# Patient Record
Sex: Male | Born: 1935 | Race: White | Hispanic: No | Smoking: Never smoker
Health system: Southern US, Community
[De-identification: ages and names within clinical notes are randomized; demographics above are authoritative.]

## PROBLEM LIST (undated history)

## (undated) DIAGNOSIS — I4729 Other ventricular tachycardia: Secondary | ICD-10-CM

## (undated) DIAGNOSIS — J45909 Unspecified asthma, uncomplicated: Secondary | ICD-10-CM

## (undated) DIAGNOSIS — I495 Sick sinus syndrome: Secondary | ICD-10-CM

## (undated) DIAGNOSIS — I2510 Atherosclerotic heart disease of native coronary artery without angina pectoris: Secondary | ICD-10-CM

## (undated) DIAGNOSIS — D699 Hemorrhagic condition, unspecified: Secondary | ICD-10-CM

## (undated) DIAGNOSIS — F0280 Dementia in other diseases classified elsewhere without behavioral disturbance: Secondary | ICD-10-CM

## (undated) DIAGNOSIS — I472 Ventricular tachycardia: Secondary | ICD-10-CM

## (undated) DIAGNOSIS — I499 Cardiac arrhythmia, unspecified: Secondary | ICD-10-CM

## (undated) DIAGNOSIS — C61 Malignant neoplasm of prostate: Principal | ICD-10-CM

## (undated) DIAGNOSIS — M1990 Unspecified osteoarthritis, unspecified site: Secondary | ICD-10-CM

## (undated) DIAGNOSIS — K219 Gastro-esophageal reflux disease without esophagitis: Secondary | ICD-10-CM

## (undated) DIAGNOSIS — I10 Essential (primary) hypertension: Secondary | ICD-10-CM

## (undated) DIAGNOSIS — E785 Hyperlipidemia, unspecified: Secondary | ICD-10-CM

## (undated) DIAGNOSIS — E538 Deficiency of other specified B group vitamins: Secondary | ICD-10-CM

## (undated) HISTORY — DX: Unspecified asthma, uncomplicated: J45.909

## (undated) HISTORY — DX: Gastro-esophageal reflux disease without esophagitis: K21.9

## (undated) HISTORY — PX: BYPASS GRAFT: SHX909

## (undated) HISTORY — PX: CIRCUMCISION: SUR203

## (undated) HISTORY — PX: CHOLECYSTECTOMY: SHX55

## (undated) HISTORY — DX: Hemorrhagic condition, unspecified: D69.9

## (undated) HISTORY — PX: CORONARY ARTERY BYPASS GRAFT: SHX141

## (undated) HISTORY — DX: Unspecified osteoarthritis, unspecified site: M19.90

## (undated) HISTORY — DX: Malignant neoplasm of prostate: C61

## (undated) HISTORY — PX: INSERT / REPLACE / REMOVE PACEMAKER: SUR710

---

## 1988-08-01 DIAGNOSIS — G9341 Metabolic encephalopathy: Secondary | ICD-10-CM | POA: Diagnosis present

## 1988-08-01 DIAGNOSIS — C61 Malignant neoplasm of prostate: Secondary | ICD-10-CM

## 1988-08-01 DIAGNOSIS — I482 Chronic atrial fibrillation, unspecified: Secondary | ICD-10-CM | POA: Diagnosis present

## 1988-08-01 DIAGNOSIS — I959 Hypotension, unspecified: Secondary | ICD-10-CM | POA: Diagnosis present

## 1988-08-01 DIAGNOSIS — I2510 Atherosclerotic heart disease of native coronary artery without angina pectoris: Secondary | ICD-10-CM | POA: Diagnosis present

## 1988-08-01 HISTORY — DX: Malignant neoplasm of prostate: C61

## 2009-11-04 ENCOUNTER — Ambulatory Visit: Payer: Self-pay | Admitting: Urology

## 2009-11-18 ENCOUNTER — Ambulatory Visit: Payer: Self-pay | Admitting: Urology

## 2016-07-06 DIAGNOSIS — E782 Mixed hyperlipidemia: Secondary | ICD-10-CM | POA: Insufficient documentation

## 2016-07-06 DIAGNOSIS — I2510 Atherosclerotic heart disease of native coronary artery without angina pectoris: Secondary | ICD-10-CM | POA: Insufficient documentation

## 2016-07-06 DIAGNOSIS — C61 Malignant neoplasm of prostate: Secondary | ICD-10-CM | POA: Insufficient documentation

## 2016-07-07 ENCOUNTER — Ambulatory Visit: Payer: Self-pay | Admitting: Urology

## 2016-08-05 ENCOUNTER — Ambulatory Visit (INDEPENDENT_AMBULATORY_CARE_PROVIDER_SITE_OTHER): Payer: Medicare Other | Admitting: Urology

## 2016-08-05 ENCOUNTER — Other Ambulatory Visit: Payer: Self-pay

## 2016-08-05 ENCOUNTER — Other Ambulatory Visit
Admission: RE | Admit: 2016-08-05 | Discharge: 2016-08-05 | Disposition: A | Discharge status: Home / Self Care | Payer: Medicare Other | Source: Ambulatory Visit | Attending: Urology | Admitting: Urology

## 2016-08-05 ENCOUNTER — Encounter: Payer: Self-pay | Admitting: Urology

## 2016-08-05 VITALS — BP 148/73 | HR 75 | Ht 62.0 in | Wt 163.0 lb

## 2016-08-05 DIAGNOSIS — N529 Male erectile dysfunction, unspecified: Secondary | ICD-10-CM | POA: Insufficient documentation

## 2016-08-05 DIAGNOSIS — C61 Malignant neoplasm of prostate: Secondary | ICD-10-CM

## 2016-08-05 DIAGNOSIS — K644 Residual hemorrhoidal skin tags: Secondary | ICD-10-CM

## 2016-08-05 NOTE — Progress Notes (Signed)
08/05/2016 5:15 PM   Harry Vargas 06/10/1929 [id number]  Referring provider: Rusty Aus, MD [facility] [place], [place] [address]  Chief Complaint  Patient presents with  . New Patient (Initial Visit)    Prostate cancer    HPI: 83 year old male who presents today to establish care in [facility] for history of prostate cancer.  He was previously followed by Dr. Harless Nakayama at [facility] until recently.  He and his wife have recently moved to live with his daughter who lives in the area.  He has a history of cT1c prostate cancer, Gleason 7 managed on Lupron.  It appears that this was likely diagnosed in 2011 by Dr. Bernardo Heater while living in the [place] area for a year.  Archived bone scan from that time shows no obvious evidence of metastatic disease.  It does appear that he also had an MR with and without contrast of the pelvis which appears to show organ confined disease but does not remark on any structures outside of the prostate. Mr. Wootton thinks his PSA was around 14 at the time of diagnosis.  He moved to [place] shortly thereafter at which time his options were discussed. He recalls being offered radiation, prostatectomy, or androgen deprivation therapy which he elected. He's been on the medication of her since.  He is tolerated it quite well. He does have occasional rare hot flashes. He's had some issues with libido and erectile dysfunction.  He does take calcium and vitamin D supplementation.  He did have a recent labs and his ADT therapy in 2016 at which time his testosterone rose to 270 and his PSA jumped to a 0.36 on 12/26/2014.    Based on the records reviewed today, at least 50+ pages, date of diagnosis, actual pathology report, staging workup, PSA at the time of diagnosis are all unknown.  It seems like his last Lupron injection was some time in late 2016 or early 2017.  He denies any recent weight  loss or bone pain.  He does complain today of erectile dysfunction. He does wife have been discussing the possibility of becoming sexually active again. He's had issues achieving and maintaining an erection. He spots of over-the-counter supplements from [facility] to help with this but see no effect. He's never tried Viagra or any other PDE 5 inhibitor. He has no contraindications for this. He does have a remote history of coronary artery disease but does not take nitrates and his stable.  He does have a history of phimosis and underwent recent circumcision within the past few years.     PMH: Past Medical History:  Diagnosis Date  . Arthritis   . Asthma   . Bleeding disorder ([place])   . GERD (gastroesophageal reflux disease)   . Prostate cancer [facility])     Surgical History: Past Surgical History:  Procedure Laterality Date  . BYPASS GRAFT    . CIRCUMCISION      Home Medications:  Allergies as of 08/05/2016   Not on File     Medication List       Accurate as of 08/05/16  5:15 PM. Always use your most recent med list.          acetaminophen 650 MG CR tablet Commonly known as:  TYLENOL Take by mouth.   Apple Cider Vinegar 600 MG Caps Take by mouth.   aspirin EC 81 MG tablet Take by mouth.   clonazePAM 1 MG tablet Commonly known as:  KLONOPIN Take by mouth.   polyethylene glycol powder powder Commonly known as:  GLYCOLAX/MIRALAX Take by mouth.   pravastatin 20 MG tablet Commonly known as:  PRAVACHOL Take by mouth.       Allergies: Not on File  Family History: Family History  Problem Relation Age of Onset  . Prostate cancer Neg Hx   . Bladder Cancer Neg Hx   . Kidney cancer Neg Hx     Social History:  reports that he has never smoked. He has never used smokeless tobacco. He reports that he does not drink alcohol or use drugs.  ROS: UROLOGY Frequent Urination?: Yes Hard to postpone urination?: No Burning/pain with urination?: No Get up at night to urinate?:  No Leakage of urine?: No Urine stream starts and stops?: No Trouble starting stream?: Yes Do you have to strain to urinate?: No Blood in urine?: No Urinary tract infection?: No Sexually transmitted disease?: No Injury to kidneys or bladder?: No Painful intercourse?: No Weak stream?: No Erection problems?: No Penile pain?: No  Gastrointestinal Nausea?: No Vomiting?: No Indigestion/heartburn?: No Diarrhea?: No Constipation?: No  Constitutional Fever: No Night sweats?: No Weight loss?: No Fatigue?: No  Skin Skin rash/lesions?: No Itching?: No  Eyes Blurred vision?: No Double vision?: No  Ears/Nose/Throat Sore throat?: No Sinus problems?: No  Hematologic/Lymphatic Swollen glands?: No Easy bruising?: No  Cardiovascular Leg swelling?: No Chest pain?: No  Respiratory Cough?: No Shortness of breath?: No  Endocrine Excessive thirst?: No  Musculoskeletal Back pain?: Yes Joint pain?: Yes  Neurological Headaches?: No Dizziness?: No  Psychologic Depression?: No Anxiety?: No  Physical Exam: BP (!) 148/73 (BP Location: Right Arm, Patient Position: Sitting, Cuff Size: Normal)   Pulse 75   Ht 5\' 2"  (1.575 m)   Wt 163 lb (73.9 kg)   BMI 29.81 kg/m   Constitutional:  Alert and oriented, No acute distress.  Accompanied by daughter today. HEENT: [place] AT, moist mucus membranes.  Trachea midline, no masses. Cardiovascular: No clubbing, cyanosis, or edema. Respiratory: Normal respiratory effort, no increased work of breathing. GI: Abdomen is soft, nontender, nondistended, no abdominal masses.  Small reducible the local hernia appreciated. Multiple abdominal scars. GU: No CVA tenderness. Circumcised phallus with easily retractable redundant foreskin. Bilateral descended testicles. Rectal: Significant inflamed and thrombosed external hemorrhoids, scant blood on pad and underwear appreciated. Prostate is small, 15-20 cc, mobile, nontender, no nodules. Skin: No  rashes, bruises or suspicious lesions. Lymph: No cervical or inguinal adenopathy. Neurologic: Grossly intact, no focal deficits, moving all 4 extremities. Psychiatric: Normal mood and affect.  Laboratory Data:  Labs performed at cranial clinic on 07/06/2016 Hemoglobin 15.2 Creatinine 1.1 UA unremarkable, no blood or evidence of infection  Pertinent Imaging: Scans from 2011 including bone scan and prostate MRI reviewed- report only  Assessment & Plan:    1. Prostate cancer [facility]) History probable localized T1c prostate cancer diagnosed in 2011, Gleason 7 managed on continuous ADT  (records somewhat limited) Based on review of PSA data, it appears that he had a small hiatus in 2016 from his ADT with significant rebound and his PSA which is somewhat concerning Plan to check PSAs/testosterone for baseline, unclear if overdue for Lupron If PSA is above nadir of what appears to be 0.3, we'll reinitiate ADT chronically Briefly reviewed the risks and benefits of chronic ADT Advised to continue calcium/vitamin D  Will call with PSA /testosterone results and arrange for reinitiation of therapy  - PSA - Testosterone  2. Erectile dysfunction, unspecified erectile  dysfunction type Interested in further treatment of erectile dysfunction  We discussed the pathophysiology of erectile dysfunction today along with possible congestive any factors. Discussed possible treatment options including PDE 5 inhibitors, vacuum erectile device, intracavernosal injection, MUSE, and placement of the inflatable or malleable penal prosthesis for refractory cases.  In terms of PDE 5 inhibitors, we discussed contraindications for this medication as well as common side effects. Patient was counseled on optimal use. All of his questions were answered in detail.  He was given samples of Viagra today, 100 mg advised to start with a half tablet.  He will call us and let us know if he like [place] to prescribe the generic  version of this medication.  3. External hemorrhoid, bleeding Bleeding, irritated external hemorrhoids appreciated today Findings reviewed with the patient and his daughter For symptomatic relief, with highly consider intervention for these,  Advised to discuss GI referral with PCP  Will call with follow up plan  Hollice Espy, MD  [place] [address], [place],  [address] [phone]  I spent 45 min with this patient of which greater than 50% was spent in counseling and coordination of care with the patient.  Greater than 50 pages of records along with previous imaging was reviewed.

## 2016-08-06 LAB — TESTOSTERONE

## 2016-08-06 LAB — PSA: PSA: 0.31 ng/mL (ref 0.00–4.00)

## 2016-08-09 ENCOUNTER — Telehealth: Payer: Self-pay

## 2016-08-09 NOTE — Telephone Encounter (Signed)
Pt daughter called requesting lab results and if pt is going to need a lupron injection. Please advise.

## 2016-08-10 NOTE — Telephone Encounter (Signed)
Discussed findings with patient's daughter. Patient remains a castrate levels, testosterone undetectable. His PSA stable at 0.3.  Based on these results, it is unclear when he received his last Lupron injection. I discussed that we will need to get the records from the cancer center where he was receiving these injections in order to determine when to give the next dose.  They will come by the office and by then, likely next Friday to sign a release to get these records.  In the interim, I have recommended follow-up in 3 months if we are unable to obtain these records. We could recheck PSA/testosterone then and assess whether or not clinically another doses warranted at that time.  Hollice Espy, MD

## 2016-08-22 NOTE — Telephone Encounter (Signed)
Records of pt receiving Lupron injections from [facility] in [place] were faxed over. Will have Angie scan them into chart.

## 2016-08-25 NOTE — Telephone Encounter (Signed)
Reviewed records from [place]. Patient last received Lupron on 01/12/2016.  This was a 6 month Depo.  Given that he is done very well with Lupron and that his PSA rebounded fairly significantly during a short break of ADT, feel that the best course of treatment would be to continue this.  He is due for his next Lupron shot. Please schedule a 6 month depo. I would like to see him in 6 months when he is due for his next Lupron with a PSA.   Hollice Espy, MD

## 2016-08-25 NOTE — Telephone Encounter (Signed)
App made and patient notified ° °Harry Vargas °

## 2016-08-26 ENCOUNTER — Ambulatory Visit (INDEPENDENT_AMBULATORY_CARE_PROVIDER_SITE_OTHER): Payer: Medicare Other | Admitting: Urology

## 2016-08-26 ENCOUNTER — Encounter: Payer: Self-pay | Admitting: Urology

## 2016-08-26 VITALS — BP 131/62 | HR 66

## 2016-08-26 DIAGNOSIS — C61 Malignant neoplasm of prostate: Secondary | ICD-10-CM | POA: Diagnosis not present

## 2016-08-26 MED ORDER — LEUPROLIDE ACETATE (6 MONTH) 45 MG IM KIT
45.0000 mg | PACK | Freq: Once | INTRAMUSCULAR | Status: AC
  Administered 2016-08-26: 45 mg via INTRAMUSCULAR

## 2016-08-26 NOTE — Progress Notes (Signed)
Lupron IM Injection   Due to Prostate Cancer patient is present today for a Lupron Injection.  Medication: Lupron 6 month Dose: 45 mg  Location: right upper outer buttocks Lot: [id number] Exp: 06/11/2017  Patient tolerated well, no complications were noted  Performed by: Fonnie Jarvis, CMA  Follow up: 6 months with PSA and Lupron

## 2016-09-02 ENCOUNTER — Telehealth: Payer: Self-pay | Admitting: Urology

## 2016-09-02 NOTE — Telephone Encounter (Signed)
Generally recommend bone health on ADT, recommend 1000-1200 mg daily calcium suppliment and [phone] IU vit D daily.   Please let him know and add to chart.  Hollice Espy, MD

## 2016-09-02 NOTE — Telephone Encounter (Signed)
Patient came into the office today to ask about medications.  In the past, he has taken an over the counter Calcium 600mg , plus Vitamin D 800IU twice a day supplement.  His previous urologist would tell him to stop taking it from time to time.  He is wondering if you think he should take it and if so, he wants to have this added to his medication list in his chart.   Please advise.

## 2016-09-05 NOTE — Telephone Encounter (Signed)
Spoke with pt in reference to bone health with ADT. Pt stated he was not understanding what was being said and requested to come to [facility]. Pt was added to nurse schedule.

## 2016-09-05 NOTE — Telephone Encounter (Signed)
LMOM

## 2016-09-09 ENCOUNTER — Ambulatory Visit: Payer: Medicare Other | Admitting: Urology

## 2016-09-27 DIAGNOSIS — I10 Essential (primary) hypertension: Secondary | ICD-10-CM | POA: Diagnosis present

## 2016-11-11 ENCOUNTER — Ambulatory Visit: Payer: Medicare Other | Admitting: Urology

## 2017-02-22 ENCOUNTER — Telehealth: Payer: Self-pay | Admitting: Urology

## 2017-02-22 NOTE — Telephone Encounter (Signed)
FYI Dr. Erlene Quan, Pt's daughter, Jackelyn Poling, [place] office left message on VM stating that she had to cancel her father's Lupron appt Friday February 24, 2017 due to him just getting out of hospital with pneumonia and not feeling well. Jackelyn Poling states that she will call to reschedule this appt when her father is feeling better.  Please be aware. Thanks.

## 2017-02-24 ENCOUNTER — Ambulatory Visit: Payer: Medicare Other | Admitting: Urology

## 2017-10-26 DIAGNOSIS — Z950 Presence of cardiac pacemaker: Secondary | ICD-10-CM | POA: Insufficient documentation

## 2018-01-10 DIAGNOSIS — I4892 Unspecified atrial flutter: Secondary | ICD-10-CM | POA: Diagnosis present

## 2018-11-30 ENCOUNTER — Emergency Department
Admission: EM | Admit: 2018-11-30 | Discharge: 2018-12-01 | Disposition: A | Discharge status: Other Acute Inpatient Hospital | Payer: Medicare Other | Attending: Emergency Medicine | Admitting: Emergency Medicine

## 2018-11-30 ENCOUNTER — Emergency Department: Payer: Medicare Other

## 2018-11-30 ENCOUNTER — Other Ambulatory Visit: Payer: Self-pay

## 2018-11-30 ENCOUNTER — Encounter: Payer: Self-pay | Admitting: *Deleted

## 2018-11-30 DIAGNOSIS — Z79899 Other long term (current) drug therapy: Secondary | ICD-10-CM | POA: Insufficient documentation

## 2018-11-30 DIAGNOSIS — R55 Syncope and collapse: Secondary | ICD-10-CM | POA: Diagnosis present

## 2018-11-30 DIAGNOSIS — J189 Pneumonia, unspecified organism: Secondary | ICD-10-CM

## 2018-11-30 DIAGNOSIS — Z7982 Long term (current) use of aspirin: Secondary | ICD-10-CM | POA: Insufficient documentation

## 2018-11-30 DIAGNOSIS — J45909 Unspecified asthma, uncomplicated: Secondary | ICD-10-CM | POA: Diagnosis not present

## 2018-11-30 DIAGNOSIS — A419 Sepsis, unspecified organism: Secondary | ICD-10-CM | POA: Diagnosis not present

## 2018-11-30 DIAGNOSIS — Z8546 Personal history of malignant neoplasm of prostate: Secondary | ICD-10-CM | POA: Diagnosis not present

## 2018-11-30 DIAGNOSIS — I959 Hypotension, unspecified: Secondary | ICD-10-CM

## 2018-11-30 DIAGNOSIS — Z20828 Contact with and (suspected) exposure to other viral communicable diseases: Secondary | ICD-10-CM | POA: Insufficient documentation

## 2018-11-30 LAB — HEPATIC FUNCTION PANEL
ALT: 22 U/L (ref 0–44)
AST: 31 U/L (ref 15–41)
Albumin: 4 g/dL (ref 3.5–5.0)
Alkaline Phosphatase: 69 U/L (ref 38–126)
Bilirubin, Direct: 0.1 mg/dL (ref 0.0–0.2)
Indirect Bilirubin: 1 mg/dL — ABNORMAL HIGH (ref 0.3–0.9)
Total Bilirubin: 1.1 mg/dL (ref 0.3–1.2)
Total Protein: 6.8 g/dL (ref 6.5–8.1)

## 2018-11-30 LAB — TROPONIN I: Troponin I: 0.03 ng/mL (ref <0.03)

## 2018-11-30 LAB — URINALYSIS, COMPLETE (UACMP) WITH MICROSCOPIC
Bacteria, UA: NONE SEEN
Bilirubin Urine: NEGATIVE
Glucose, UA: NEGATIVE mg/dL
Hgb urine dipstick: NEGATIVE
Ketones, ur: NEGATIVE mg/dL
Leukocytes,Ua: NEGATIVE
Nitrite: NEGATIVE
Protein, ur: 30 mg/dL — AB
Specific Gravity, Urine: 1.028 (ref 1.005–1.030)
pH: 5 (ref 5.0–8.0)

## 2018-11-30 LAB — LACTIC ACID, PLASMA
Lactic Acid, Venous: 3 mmol/L (ref 0.5–1.9)
Lactic Acid, Venous: 2.4 mmol/L (ref 0.5–1.9)

## 2018-11-30 LAB — CBC
HCT: 49.2 % (ref 39.0–52.0)
Hemoglobin: 16 g/dL (ref 13.0–17.0)
MCH: 27.2 pg (ref 26.0–34.0)
MCHC: 32.5 g/dL (ref 30.0–36.0)
MCV: 83.5 fL (ref 80.0–100.0)
Platelets: 161 10*3/uL (ref 150–400)
RBC: 5.89 MIL/uL — ABNORMAL HIGH (ref 4.22–5.81)
RDW: 14.7 % (ref 11.5–15.5)
WBC: 18.4 10*3/uL — ABNORMAL HIGH (ref 4.0–10.5)
nRBC: 0 % (ref 0.0–0.2)

## 2018-11-30 LAB — BASIC METABOLIC PANEL
Anion gap: 11 (ref 5–15)
BUN: 26 mg/dL — ABNORMAL HIGH (ref 8–23)
CO2: 24 mmol/L (ref 22–32)
Calcium: 9.5 mg/dL (ref 8.9–10.3)
Chloride: 107 mmol/L (ref 98–111)
Creatinine, Ser: 1.29 mg/dL — ABNORMAL HIGH (ref 0.61–1.24)
GFR calc Af Amer: 57 mL/min — ABNORMAL LOW (ref >60)
GFR calc non Af Amer: 50 mL/min — ABNORMAL LOW (ref >60)
Glucose, Bld: 141 mg/dL — ABNORMAL HIGH (ref 70–99)
Potassium: 3.7 mmol/L (ref 3.5–5.1)
Sodium: 142 mmol/L (ref 135–145)

## 2018-11-30 LAB — GLUCOSE, CAPILLARY: Glucose-Capillary: 129 mg/dL — ABNORMAL HIGH (ref 70–99)

## 2018-11-30 LAB — SARS CORONAVIRUS 2 BY RT PCR (HOSPITAL ORDER, PERFORMED IN ~~LOC~~ HOSPITAL LAB): SARS Coronavirus 2: NEGATIVE

## 2018-11-30 MED ORDER — SODIUM CHLORIDE 0.9 % IV BOLUS
1000.0000 mL | Freq: Once | INTRAVENOUS | Status: AC
  Administered 2018-11-30: 17:00:00 1000 mL via INTRAVENOUS

## 2018-11-30 MED ORDER — VANCOMYCIN HCL IN DEXTROSE 750-5 MG/150ML-% IV SOLN: 750.0000 mg | INTRAVENOUS | Status: DC

## 2018-11-30 MED ORDER — SODIUM CHLORIDE 0.9 % IV SOLN: 2.0000 g | Freq: Two times a day (BID) | INTRAVENOUS | Status: DC

## 2018-11-30 MED ORDER — VANCOMYCIN HCL IN DEXTROSE 1-5 GM/200ML-% IV SOLN: 1000.0000 mg | Freq: Once | INTRAVENOUS | Status: DC

## 2018-11-30 MED ORDER — SODIUM CHLORIDE 0.9 % IV SOLN
2.0000 g | Freq: Once | INTRAVENOUS | Status: AC
  Administered 2018-11-30: 2 g via INTRAVENOUS
  Filled 2018-11-30: qty 2

## 2018-11-30 MED ORDER — VANCOMYCIN HCL 10 G IV SOLR
1500.0000 mg | Freq: Once | INTRAVENOUS | Status: AC
  Administered 2018-11-30: 17:00:00 1500 mg via INTRAVENOUS
  Filled 2018-11-30: qty 1500

## 2018-11-30 MED ORDER — SODIUM CHLORIDE 0.9 % IV BOLUS
1000.0000 mL | Freq: Once | INTRAVENOUS | Status: AC
  Administered 2018-11-30: 15:00:00 1000 mL via INTRAVENOUS

## 2018-11-30 MED ORDER — METRONIDAZOLE IN NACL 5-0.79 MG/ML-% IV SOLN
500.0000 mg | Freq: Once | INTRAVENOUS | Status: AC
  Administered 2018-11-30: 500 mg via INTRAVENOUS
  Filled 2018-11-30: qty 100

## 2018-11-30 MED ORDER — SODIUM CHLORIDE 0.9 % IV SOLN
INTRAVENOUS | Status: DC
  Administered 2018-11-30: 21:00:00 via INTRAVENOUS

## 2018-11-30 MED ORDER — SODIUM CHLORIDE 0.9% FLUSH: 3.0000 mL | Freq: Once | INTRAVENOUS | Status: DC

## 2018-11-30 NOTE — ED Notes (Signed)
Pt's daughter Neoma Laming updated briefly over phone per pt request.

## 2018-11-30 NOTE — ED Notes (Signed)
Pt given food tray, apple juice, and water verbal okay from [place].

## 2018-11-30 NOTE — ED Notes (Signed)
Pt's daughter requesting pt be transferred to [facility]. Admitting notified. Will notify EDP.

## 2018-11-30 NOTE — ED Notes (Signed)
Pt's rhythm paced. Pt resting in bed comfortably. Rails up. Bed in lowest position. Reminded that urine sample needed.

## 2018-11-30 NOTE — ED Provider Notes (Addendum)
[facility] Emergency Department Provider Note  Time seen: 2:23 PM  I have reviewed the triage vital signs and the nursing notes.   HISTORY  Chief Complaint Loss of Consciousness   HPI Harry Vargas is a 85 y.o. male with a past medical history of arthritis, asthma, gastric reflux, presents to the emergency department for near syncope and hypotension.  According to the patient he was at home and he was shaking, feeling very unwell, felt very cold.  Patient called EMS who came out and checked him out, administered Tylenol but the patient did not want to be transported to the hospital at that time.  Patient then went to [facility] clinic walk-in to be evaluated.  Patient was found to be very weak and had a near syncopal episode while being evaluated.  Found to be hypotensive in the 60s and brought emergently to the emergency department for evaluation.  Upon arrival patient is awake alert and oriented, he is hypotensive 64/41.  Patient is afebrile but received Tylenol at home, unclear if the patient had a fever previously.  Denies any known fever today but states he has been shaking with chills all day.  Denies any cough or congestion.  Denies any chest or abdominal pain.  Denies any dysuria.  Largely negative review of systems.   Past Medical History:  Diagnosis Date  . Arthritis   . Asthma   . Bleeding disorder ([place])   . GERD (gastroesophageal reflux disease)   . Prostate cancer [facility])     Patient Active Problem List   Diagnosis Date Noted  . 3-vessel CAD 07/06/2016  . Cancer of prostate ([place]) 07/06/2016  . Hyperlipidemia, mixed 07/06/2016    Past Surgical History:  Procedure Laterality Date  . BYPASS GRAFT    . CIRCUMCISION      Prior to Admission medications   Medication Sig Start Date End Date Taking? Authorizing Provider  ipratropium (ATROVENT) 0.03 % nasal spray Place 1 spray into the nose 2 (two) times daily. 08/29/18 08/29/19 Yes [provider]   acetaminophen (TYLENOL) 650 MG CR tablet Take by mouth.    [provider]  Apple Cider Vinegar 600 MG CAPS Take by mouth.    [provider]  aspirin EC 81 MG tablet Take by mouth.    [provider]  Calcium 600-200 MG-UNIT tablet Take 1 tablet by mouth 2 (two) times daily with a meal.    [provider]  clonazePAM (KLONOPIN) 1 MG tablet Take by mouth.    [provider]  Leuprolide Acetate, 6 Month, (LUPRON DEPOT, 32-MONTH,) 45 MG injection Inject 45 mg into the muscle every 6 (six) months.    [provider]  polyethylene glycol powder (GLYCOLAX/MIRALAX) powder Take by mouth.    [provider]  pravastatin (PRAVACHOL) 20 MG tablet Take by mouth.    [provider]  vitamin B-12 (CYANOCOBALAMIN) 1000 MCG tablet Take 1,000 mcg by mouth daily.    [provider]    No Known Allergies  Family History  Problem Relation Age of Onset  . Prostate cancer Neg Hx   . Bladder Cancer Neg Hx   . Kidney cancer Neg Hx     Social History Social History   Tobacco Use  . Smoking status: Never Smoker  . Smokeless tobacco: Never Used  Substance Use Topics  . Alcohol use: No  . Drug use: No    Review of Systems Constitutional: No known fever.  Positive for chills  ENT: Negative for recent illness/congestion Cardiovascular: Negative for chest pain. Respiratory: Negative for shortness of breath.  Negative for cough. Gastrointestinal: Negative for abdominal pain, vomiting and diarrhea. Genitourinary: Negative for urinary compaints Musculoskeletal: Negative for musculoskeletal complaints Skin: Negative for skin complaints  Neurological: Negative for headache All other ROS negative  ____________________________________________   PHYSICAL EXAM:  VITAL SIGNS: ED Triage Vitals  Enc Vitals Group     BP 11/30/18 1401 (!) 64/41     Pulse Rate 11/30/18 1401 84     Resp 11/30/18 1401 14     Temp 11/30/18 1401  98.5 F (36.9 C)     Temp Source 11/30/18 1401 Oral     SpO2 11/30/18 1401 92 %     Weight 11/30/18 1402 150 lb (68 kg)     Height 11/30/18 1402 5\' 4"  (1.626 m)     Head Circumference --      Peak Flow --      Pain Score 11/30/18 1402 0     Pain Loc --      Pain Edu? --      Excl. in [place]? --    Constitutional: Patient is awake and alert, no acute distress. Eyes: Normal exam ENT      Head: Normocephalic and atraumatic.      Mouth/Throat: Mucous membranes are moist. Cardiovascular: Normal rate, regular rhythm.  Respiratory: Normal respiratory effort without tachypnea nor retractions. Breath sounds are clear  Gastrointestinal: Soft and nontender. No distention.   Musculoskeletal: Nontender with normal range of motion in all extremities. No lower extremity tenderness or edema. Neurologic:  Normal speech and language. No gross focal neurologic deficits Skin:  Skin is warm, dry and intact.  Psychiatric: Mood and affect are normal.   ____________________________________________    EKG  EKG viewed and interpreted by myself shows an atrial paced rhythm at 79 bpm with a narrow QRS, left axis deviation, largely normal intervals with nonspecific ST changes.  ____________________________________________    RADIOLOGY  Chest x-ray shows bibasilar opacification atelectasis versus infection.  ____________________________________________   INITIAL IMPRESSION / ASSESSMENT AND PLAN / ED COURSE  Pertinent labs & imaging results that were available during my care of the patient were reviewed by me and considered in my medical decision making (see chart for details).   Patient presents to the emergency department for near syncope found to be hypotensive 64/41.  Reported chills at home but no measured temperature at any point.  Differential is quite broad but would include hypotension due to infectious etiology such as sepsis, urinary tract infection or pneumonia, hypotension due to renal  insufficiency, hypovolemia or dehydration.  We will check labs, blood cultures, urinalysis, urine culture.  We will IV hydrate, obtain chest x-ray imaging and continue to closely monitor.  Lab work is resulting showing a leukocytosis of 18,000, lactate greater than 3.0.  Given the patient's hypotension leukocytosis elevated lactic acidosis we will cover broad-spectrum antibiotics, start IV fluids.  Patient will require admission to the hospital service.  I discussed patient with Dr. Posey Pronto of the hospitalist service, we will check a COVID screen prior to admission  Harry Vargas was evaluated in Emergency Department on 11/30/2018 for the symptoms described in the history of present illness. He was evaluated in the context of the global COVID-19 pandemic, which necessitated consideration that the patient might be at risk for infection with the SARS-CoV-2 virus that causes COVID-19. Institutional protocols and algorithms that pertain to the evaluation of patients at risk for COVID-19  are in a state of rapid change based on information released by regulatory bodies including the CDC and federal and state organizations. These policies and algorithms were followed during the patient's care in the ED.  CRITICAL CARE Performed by: Harvest Dark   Total critical care time: 30 minutes  Critical care time was exclusive of separately billable procedures and treating other patients.  Critical care was necessary to treat or prevent imminent or life-threatening deterioration.  Critical care was time spent personally by me on the following activities: development of treatment plan with patient and/or surrogate as well as nursing, discussions with consultants, evaluation of patient's response to treatment, examination of patient, obtaining history from patient or surrogate, ordering and performing treatments and interventions, ordering and review of laboratory studies, ordering and review of radiographic studies,  pulse oximetry and re-evaluation of patient's condition.   ____________________________________________   FINAL CLINICAL IMPRESSION(S) / ED DIAGNOSES  Near syncope Hypotension Sepsis Pneumonia   Harvest Dark, MD 11/30/18 1529    Harvest Dark, MD 01/01/19 2037

## 2018-11-30 NOTE — ED Notes (Signed)
[facility] [id number]; [id number] report number. Harry Vargas Dr. [phone] transport number.

## 2018-11-30 NOTE — ED Notes (Signed)
ED TO INPATIENT HANDOFF REPORT  ED Nurse Name and Phone #: Metta Clines [phone]  S Name/Age/Gender Allen Kell 85 y.o. male Room/Bed: ED11A/ED11A  Code Status   Code Status: Not on file  Home/SNF/Other Home Patient oriented to: self, place, time and situation Is this baseline? Yes   Triage Complete: Triage complete  Chief Complaint Loss of Conciousness   Triage Note No notes on file   Allergies No Known Allergies  Level of Care/Admitting Diagnosis ED Disposition    None      B Medical/Surgery History Past Medical History:  Diagnosis Date  . Arthritis   . Asthma   . Bleeding disorder ([place])   . GERD (gastroesophageal reflux disease)   . Prostate cancer [facility])    Past Surgical History:  Procedure Laterality Date  . BYPASS GRAFT    . CIRCUMCISION       A IV Location/Drains/Wounds Patient Lines/Drains/Airways Status   Active Line/Drains/Airways    Name:   Placement date:   Placement time:   Site:   Days:   Peripheral IV 11/30/18 Left Forearm   11/30/18    1424    Forearm   less than 1   Peripheral IV 11/30/18 Right Wrist   11/30/18    1519    Wrist   less than 1          Intake/Output Last 24 hours No intake or output data in the 24 hours ending 11/30/18 1812  Labs/Imaging Results for orders placed or performed during the hospital encounter of 11/30/18 (from the past 48 hour(s))  Glucose, capillary     Status: Abnormal   Collection Time: 11/30/18  2:00 PM  Result Value Ref Range   Glucose-Capillary 129 (H) 70 - 99 mg/dL   Comment 1 Document in Chart   Basic metabolic panel     Status: Abnormal   Collection Time: 11/30/18  2:08 PM  Result Value Ref Range   Sodium 142 135 - 145 mmol/L   Potassium 3.7 3.5 - 5.1 mmol/L   Chloride 107 98 - 111 mmol/L   CO2 24 22 - 32 mmol/L   Glucose, Bld 141 (H) 70 - 99 mg/dL   BUN 26 (H) 8 - 23 mg/dL   Creatinine, Ser 1.29 (H) 0.61 - 1.24 mg/dL   Calcium 9.5 8.9 - 10.3 mg/dL   GFR calc non Af Amer 50 (L) >60  mL/min   GFR calc Af Amer 57 (L) >60 mL/min   Anion gap 11 5 - 15    Comment: Performed at [facility], [address] [place], [place] [address]  CBC     Status: Abnormal   Collection Time: 11/30/18  2:08 PM  Result Value Ref Range   WBC 18.4 (H) 4.0 - 10.5 K/uL   RBC 5.89 (H) 4.22 - 5.81 MIL/uL   Hemoglobin 16.0 13.0 - 17.0 g/dL   HCT 49.2 39.0 - 52.0 %   MCV 83.5 80.0 - 100.0 fL   MCH 27.2 26.0 - 34.0 pg   MCHC 32.5 30.0 - 36.0 g/dL   RDW 14.7 11.5 - 15.5 %   Platelets 161 150 - 400 K/uL   nRBC 0.0 0.0 - 0.2 %    Comment: Performed at [facility], [address] [place], [place] [address]  Hepatic function panel     Status: Abnormal   Collection Time: 11/30/18  2:08 PM  Result Value Ref Range   Total Protein 6.8 6.5 - 8.1 g/dL   Albumin  4.0 3.5 - 5.0 g/dL   AST 31 15 - 41 U/L   ALT 22 0 - 44 U/L   Alkaline Phosphatase 69 38 - 126 U/L   Total Bilirubin 1.1 0.3 - 1.2 mg/dL   Bilirubin, Direct 0.1 0.0 - 0.2 mg/dL   Indirect Bilirubin 1.0 (H) 0.3 - 0.9 mg/dL    Comment: Performed at [facility], [address] [place], [place] [address]  Troponin I - Add-On to previous collection     Status: Abnormal   Collection Time: 11/30/18  2:08 PM  Result Value Ref Range   Troponin I 0.03 (HH) <0.03 ng/mL    Comment: CRITICAL RESULT CALLED TO, READ BACK BY AND VERIFIED WITH GEORGIE Burnard Enis 11/30/18 @ 1533  [place] Performed at [facility], [address] [place], [place] [address]   Lactic acid, plasma     Status: Abnormal   Collection Time: 11/30/18  2:22 PM  Result Value Ref Range   Lactic Acid, Venous 3.0 (HH) 0.5 - 1.9 mmol/L    Comment: CRITICAL RESULT CALLED TO, READ BACK BY AND VERIFIED WITH STEPHEN JONES 11/30/18 @ [place] Performed at [facility], [address] [place], [place] [address]   SARS Coronavirus 2 [facility] order, Performed in [place] hospital lab)     Status: None   Collection Time: 11/30/18  3:51 PM   Result Value Ref Range   SARS Coronavirus 2 NEGATIVE NEGATIVE    Comment: (NOTE) If result is NEGATIVE SARS-CoV-2 target nucleic acids are NOT DETECTED. The SARS-CoV-2 RNA is generally detectable in upper and lower  respiratory specimens during the acute phase of infection. The lowest  concentration of SARS-CoV-2 viral copies this assay can detect is 250  copies / mL. A negative result does not preclude SARS-CoV-2 infection  and should not be used as the sole basis for treatment or other  patient management decisions.  A negative result may occur with  improper specimen collection / handling, submission of specimen other  than nasopharyngeal swab, presence of viral mutation(s) within the  areas targeted by this assay, and inadequate number of viral copies  (<250 copies / mL). A negative result must be combined with clinical  observations, patient history, and epidemiological information. If result is POSITIVE SARS-CoV-2 target nucleic acids are DETECTED. The SARS-CoV-2 RNA is generally detectable in upper and lower  respiratory specimens dur ing the acute phase of infection.  Positive  results are indicative of active infection with SARS-CoV-2.  Clinical  correlation with patient history and other diagnostic information is  necessary to determine patient infection status.  Positive results do  not rule out bacterial infection or co-infection with other viruses. If result is PRESUMPTIVE POSTIVE SARS-CoV-2 nucleic acids MAY BE PRESENT.   A presumptive positive result was obtained on the submitted specimen  and confirmed on repeat testing.  While 2019 novel coronavirus  (SARS-CoV-2) nucleic acids may be present in the submitted sample  additional confirmatory testing may be necessary for epidemiological  and / or clinical management purposes  to differentiate between  SARS-CoV-2 and other Sarbecovirus currently known to infect humans.  If clinically indicated additional testing with  an alternate test  methodology [phone]) is advised. The SARS-CoV-2 RNA is generally  detectable in upper and lower respiratory sp ecimens during the acute  phase of infection. The expected result is Negative. Fact Sheet for Patients:  StrictlyIdeas.no Fact Sheet for Healthcare Providers: BankingDealers.co.za This test is not yet approved or cleared  by the [place] and has been authorized for detection and/or diagnosis of SARS-CoV-2 by FDA under an Emergency Use Authorization (EUA).  This EUA will remain in effect (meaning this test can be used) for the duration of the COVID-19 declaration under Section 564(b)(1) of the Act, 21 U.S.C. section 360bbb-3(b)(1), unless the authorization is terminated or revoked sooner. Performed at [facility], [address], [place], [place] [address]    Dg Chest Port 1 View  Result Date: 11/30/2018 CLINICAL DATA:  New onset dizziness this morning. EXAM: PORTABLE CHEST 1 VIEW COMPARISON:  None. FINDINGS: Left-sided pacemaker intact. Patient slightly rotated to the left. Lungs are adequately inflated demonstrate mild bibasilar opacification which may be due to atelectasis or infection. No evidence of effusion. Mild cardiomegaly. Remaining bones and soft tissues are unremarkable. IMPRESSION: Minimal bibasilar opacification which may be due to atelectasis or infection. Mild cardiomegaly. Electronically Signed   By: Marin Olp M.D.   On: 11/30/2018 14:39    Pending Labs Unresulted Labs (From admission, onward)    Start     Ordered   11/30/18 1423  Urine culture  ONCE - STAT,   STAT     11/30/18 1422   11/30/18 1422  Lactic acid, plasma  Now then every 2 hours,   STAT     11/30/18 1422   11/30/18 1422  Blood Culture (routine x 2)  BLOOD CULTURE X 2,   STAT     11/30/18 1422   11/30/18 1404  Urinalysis, Complete w Microscopic  ONCE - STAT,   STAT     11/30/18 1404   Signed and Held  CBC   (enoxaparin (LOVENOX)    CrCl < 30 ml/min)  Once,   R    Comments:  Baseline for enoxaparin therapy IF NOT ALREADY DRAWN.  Notify MD if PLT < 100 K.    Signed and Held   Signed and Held  Creatinine, serum  (enoxaparin (LOVENOX)    CrCl < 30 ml/min)  Once,   R    Comments:  Baseline for enoxaparin therapy IF NOT ALREADY DRAWN.    Signed and Held   Signed and Held  Creatinine, serum  (enoxaparin (LOVENOX)    CrCl < 30 ml/min)  Weekly,   R    Comments:  while on enoxaparin therapy.    Signed and Held   Signed and Held  CBC  Tomorrow morning,   R     Signed and Held   Signed and Held  Comprehensive metabolic panel  Tomorrow morning,   R     Signed and Held          Vitals/Pain Today's Vitals   11/30/18 1401 11/30/18 1402  BP: (!) 64/41   Pulse: 84   Resp: 14   Temp: 98.5 F (36.9 C)   TempSrc: Oral   SpO2: 92%   Weight:  68 kg  Height:  5\' 4"  (1.626 m)  PainSc:  0-No pain    Isolation Precautions Droplet and Contact precautions  Medications Medications  sodium chloride flush (NS) 0.9 % injection 3 mL (has no administration in time range)  vancomycin (VANCOCIN) 1,500 mg in sodium chloride 0.9 % 500 mL IVPB (1,500 mg Intravenous New Bag/Given 11/30/18 1707)  sodium chloride 0.9 % bolus 1,000 mL (0 mLs Intravenous Stopped 11/30/18 1523)  ceFEPIme (MAXIPIME) 2 g in sodium chloride 0.9 % 100 mL IVPB (0 g Intravenous Stopped 11/30/18 1612)  metroNIDAZOLE (FLAGYL) IVPB 500 mg (0 mg  Intravenous Stopped 11/30/18 1639)  sodium chloride 0.9 % bolus 1,000 mL (1,000 mLs Intravenous New Bag/Given 11/30/18 1702)    Mobility Person assist/device High fall risk   Focused Assessments Soft BP now stabilized; A&O; HOH; received NS bolus x2; antibitocs; 2 IVs on either fa; full septic workup; elevated lactic/WBC; daughter wishes for pt to be transfered to [facility]   R Recommendations: See Admitting Provider Note  Report given to:   Additional Notes:  Please ask, thanks.

## 2018-11-30 NOTE — Consult Note (Signed)
CODE SEPSIS - PHARMACY COMMUNICATION  **Broad Spectrum Antibiotics should be administered within 1 hour of Sepsis diagnosis**  Time Code Sepsis Called/Page Received: 11/30/2018 @1507   Antibiotics Ordered: cefepime metronidazole, and vancomycin   Time of 1st antibiotic administration: [place] ,PharmD, BCPS Clinical Pharmacist  11/30/2018  3:09 PM

## 2018-11-30 NOTE — ED Notes (Signed)
Pt states he thinks he may be able to provide urine sample if he stands up. Explained risk of falling. Will assist pt to sit on side of bed to use urinal and remain with pt.

## 2018-11-30 NOTE — Consult Note (Signed)
Pharmacy Antibiotic Note  Harry Vargas is a 85 y.o. male admitted on 11/30/2018 with sepsis.  Pharmacy has been consulted for Cefepime and Vancomycin dosing.  Plan: Vancomycin 1500mg  IV x 1 dose, followed by Vancomycin 750 mg IV Q 24 hrs. Goal AUC 400-550. Expected AUC: 472 SCr used: 13.4  Start Cefepime 2g IV every 12 hours    Height: 5\' 4"  (162.6 cm) Weight: 150 lb (68 kg) IBW/kg (Calculated) : 59.2  Temp (24hrs), Avg:98.1 F (36.7 C), Min:97.6 F (36.4 C), Max:98.5 F (36.9 C)  Recent Labs  Lab 11/30/18 1408 11/30/18 1422 11/30/18 1531  WBC 18.4*  --   --   CREATININE 1.29*  --   --   LATICACIDVEN  --  3.0* 2.4*    Estimated Creatinine Clearance: 33.8 mL/min (A) (by C-G formula based on SCr of 1.29 mg/dL (H)).    No Known Allergies  Antimicrobials this admission: 5/1 Vancomycin >  5/1 Cefepime >>   Microbiology results: 5/1 BCx: pending 5/1 UCx: pending 5/1 SARS Coronavirus 2: negative   Thank you for allowing pharmacy to be a part of this patient's care.  Pernell Dupre, PharmD, BCPS Clinical Pharmacist 11/30/2018 10:05 PM

## 2018-11-30 NOTE — ED Notes (Signed)
Pt wished to speak with daughter so called daughter and gave pt phone. Pt on phone with daughter. Pt irritable. Upset as IV pump was alarmping d/t completed infusion. Call bell was within reach but pt did not use it to alert this RN. NT and RN at bedside assisting pt. Pt refusing to attempt to provide urine sample. Call bell remains within reach. Pt educated again on how to use it.

## 2018-11-30 NOTE — ED Provider Notes (Signed)
_________________________ 6:29 PM on 11/30/2018 -----------------------------------------  Patient here with sepsis from pneumonia.  Blood pressure has significantly improved after 2 L of IV fluids.  Repeat lactic is pending.  Patient received cefepime, Vanco and Flagyl.  Per request of patient's daughter Harry Vargas, patient will be transferred to [facility].  He was accepted by Dr. Jeraldine Loots at [facility].  Family is comfortable with him going to [facility].  _________________________ 11:31 PM on 11/30/2018 -----------------------------------------  Vitals continue to improve.  Patient looks well appearing.  Tolerating p.o.  Still waiting for bed to be transferred to [place], [place], MD 11/30/18 [phone]

## 2018-11-30 NOTE — ED Notes (Addendum)
This RN and NT Alissa assisted pt to stand at bedside and use urinal. Urine dark amber. Abd taut. Will bladder scan. Urine sample sent to lab. Pt give multiple pillows and repositioned in bed.

## 2018-11-30 NOTE — ED Notes (Signed)
2nd set of cultures collected via butterfly at [facility] by this RN.

## 2018-11-30 NOTE — ED Notes (Addendum)
Bladder scan max 11cc. Pt's abd distended but pt states this is normal. Bowel sounds audible in all quadrants. Pt denies abd pain. Pt requesting food/drink. Will ask EDP.

## 2018-11-30 NOTE — ED Notes (Signed)
Pt states he forgot he was missing his dentures as he left them at home. Couldn't eat the sandwich on the food tray bc of this but ate everything else (soft foods). Repositioned in bed. Updated that he was assigned room at [facility].

## 2018-12-01 NOTE — ED Notes (Signed)
Pt given update. Explained delay. Pt understands that he will be transported to [facility] soon.

## 2018-12-01 NOTE — ED Notes (Signed)
Report given to [facility] to set up transport. States closest transport team is probably 2 hours out.

## 2018-12-01 NOTE — ED Notes (Signed)
2nd report to [facility]. They're still coordinating transport.

## 2018-12-02 LAB — URINE CULTURE: Culture: <10000 — AB

## 2018-12-05 LAB — CULTURE, BLOOD (ROUTINE X 2)
Culture: NO GROWTH
Culture: NO GROWTH

## 2018-12-07 ENCOUNTER — Ambulatory Visit
Admission: RE | Admit: 2018-12-07 | Discharge: 2018-12-07 | Disposition: A | Discharge status: Home / Self Care | Payer: Medicare Other | Source: Ambulatory Visit | Attending: Internal Medicine | Admitting: Internal Medicine

## 2018-12-07 ENCOUNTER — Other Ambulatory Visit: Payer: Self-pay | Admitting: Internal Medicine

## 2018-12-07 ENCOUNTER — Other Ambulatory Visit: Payer: Self-pay

## 2018-12-07 DIAGNOSIS — R0609 Other forms of dyspnea: Secondary | ICD-10-CM | POA: Insufficient documentation

## 2018-12-07 MED ORDER — IOHEXOL 350 MG/ML SOLN
60.0000 mL | Freq: Once | INTRAVENOUS | Status: AC | PRN
  Administered 2018-12-07: 60 mL via INTRAVENOUS

## 2019-09-05 ENCOUNTER — Ambulatory Visit
Admission: RE | Admit: 2019-09-05 | Discharge: 2019-09-05 | Disposition: A | Discharge status: Home / Self Care | Payer: Medicare Other | Source: Ambulatory Visit | Attending: Internal Medicine | Admitting: Internal Medicine

## 2019-09-05 ENCOUNTER — Other Ambulatory Visit: Payer: Self-pay

## 2019-09-05 ENCOUNTER — Other Ambulatory Visit: Payer: Self-pay | Admitting: Internal Medicine

## 2019-09-05 DIAGNOSIS — K56609 Unspecified intestinal obstruction, unspecified as to partial versus complete obstruction: Secondary | ICD-10-CM

## 2019-09-05 DIAGNOSIS — F0280 Dementia in other diseases classified elsewhere without behavioral disturbance: Secondary | ICD-10-CM | POA: Diagnosis present

## 2019-09-05 DIAGNOSIS — R140 Abdominal distension (gaseous): Secondary | ICD-10-CM

## 2019-09-05 DIAGNOSIS — F02818 Dementia in other diseases classified elsewhere, unspecified severity, with other behavioral disturbance: Secondary | ICD-10-CM | POA: Diagnosis present

## 2019-09-05 MED ORDER — IOHEXOL 300 MG/ML  SOLN
100.0000 mL | Freq: Once | INTRAMUSCULAR | Status: AC | PRN
  Administered 2019-09-05: 12:00:00 100 mL via INTRAVENOUS

## 2019-09-18 ENCOUNTER — Encounter: Payer: Self-pay | Admitting: Internal Medicine

## 2019-09-19 ENCOUNTER — Ambulatory Visit: Payer: Medicare Other | Admitting: Certified Registered"

## 2019-09-19 ENCOUNTER — Encounter: Payer: Self-pay | Admitting: Internal Medicine

## 2019-09-19 ENCOUNTER — Encounter
Admission: RE | Disposition: A | Discharge status: Home / Self Care | Payer: Self-pay | Source: Home / Self Care | Attending: Internal Medicine

## 2019-09-19 ENCOUNTER — Other Ambulatory Visit: Payer: Self-pay

## 2019-09-19 ENCOUNTER — Ambulatory Visit
Admission: RE | Admit: 2019-09-19 | Discharge: 2019-09-19 | Disposition: A | Discharge status: Home / Self Care | Payer: Medicare Other | Attending: Internal Medicine | Admitting: Internal Medicine

## 2019-09-19 DIAGNOSIS — J45909 Unspecified asthma, uncomplicated: Secondary | ICD-10-CM | POA: Diagnosis not present

## 2019-09-19 DIAGNOSIS — I2510 Atherosclerotic heart disease of native coronary artery without angina pectoris: Secondary | ICD-10-CM | POA: Diagnosis not present

## 2019-09-19 DIAGNOSIS — F0280 Dementia in other diseases classified elsewhere without behavioral disturbance: Secondary | ICD-10-CM | POA: Insufficient documentation

## 2019-09-19 DIAGNOSIS — D124 Benign neoplasm of descending colon: Secondary | ICD-10-CM | POA: Insufficient documentation

## 2019-09-19 DIAGNOSIS — R933 Abnormal findings on diagnostic imaging of other parts of digestive tract: Secondary | ICD-10-CM | POA: Insufficient documentation

## 2019-09-19 DIAGNOSIS — I4892 Unspecified atrial flutter: Secondary | ICD-10-CM | POA: Diagnosis not present

## 2019-09-19 DIAGNOSIS — E785 Hyperlipidemia, unspecified: Secondary | ICD-10-CM | POA: Insufficient documentation

## 2019-09-19 DIAGNOSIS — Z79890 Hormone replacement therapy (postmenopausal): Secondary | ICD-10-CM | POA: Insufficient documentation

## 2019-09-19 DIAGNOSIS — I472 Ventricular tachycardia: Secondary | ICD-10-CM | POA: Insufficient documentation

## 2019-09-19 DIAGNOSIS — K5900 Constipation, unspecified: Secondary | ICD-10-CM | POA: Insufficient documentation

## 2019-09-19 DIAGNOSIS — K449 Diaphragmatic hernia without obstruction or gangrene: Secondary | ICD-10-CM | POA: Diagnosis not present

## 2019-09-19 DIAGNOSIS — I10 Essential (primary) hypertension: Secondary | ICD-10-CM | POA: Insufficient documentation

## 2019-09-19 DIAGNOSIS — C61 Malignant neoplasm of prostate: Secondary | ICD-10-CM | POA: Insufficient documentation

## 2019-09-19 DIAGNOSIS — K5730 Diverticulosis of large intestine without perforation or abscess without bleeding: Secondary | ICD-10-CM | POA: Diagnosis not present

## 2019-09-19 DIAGNOSIS — Z7982 Long term (current) use of aspirin: Secondary | ICD-10-CM | POA: Diagnosis not present

## 2019-09-19 DIAGNOSIS — D125 Benign neoplasm of sigmoid colon: Secondary | ICD-10-CM | POA: Diagnosis not present

## 2019-09-19 DIAGNOSIS — K625 Hemorrhage of anus and rectum: Secondary | ICD-10-CM | POA: Diagnosis not present

## 2019-09-19 DIAGNOSIS — G309 Alzheimer's disease, unspecified: Secondary | ICD-10-CM | POA: Diagnosis not present

## 2019-09-19 DIAGNOSIS — K228 Other specified diseases of esophagus: Secondary | ICD-10-CM | POA: Insufficient documentation

## 2019-09-19 DIAGNOSIS — Z79899 Other long term (current) drug therapy: Secondary | ICD-10-CM | POA: Diagnosis not present

## 2019-09-19 DIAGNOSIS — K2970 Gastritis, unspecified, without bleeding: Secondary | ICD-10-CM | POA: Insufficient documentation

## 2019-09-19 DIAGNOSIS — K640 First degree hemorrhoids: Secondary | ICD-10-CM | POA: Insufficient documentation

## 2019-09-19 DIAGNOSIS — M1990 Unspecified osteoarthritis, unspecified site: Secondary | ICD-10-CM | POA: Insufficient documentation

## 2019-09-19 HISTORY — DX: Cardiac arrhythmia, unspecified: I49.9

## 2019-09-19 HISTORY — PX: ESOPHAGOGASTRODUODENOSCOPY (EGD) WITH PROPOFOL: SHX5813

## 2019-09-19 HISTORY — DX: Essential (primary) hypertension: I10

## 2019-09-19 HISTORY — DX: Other ventricular tachycardia: I47.29

## 2019-09-19 HISTORY — DX: Dementia in other diseases classified elsewhere, unspecified severity, without behavioral disturbance, psychotic disturbance, mood disturbance, and anxiety: F02.80

## 2019-09-19 HISTORY — DX: Ventricular tachycardia: I47.2

## 2019-09-19 HISTORY — DX: Hyperlipidemia, unspecified: E78.5

## 2019-09-19 HISTORY — PX: COLONOSCOPY WITH PROPOFOL: SHX5780

## 2019-09-19 HISTORY — DX: Deficiency of other specified B group vitamins: E53.8

## 2019-09-19 HISTORY — DX: Atherosclerotic heart disease of native coronary artery without angina pectoris: I25.10

## 2019-09-19 HISTORY — DX: Sick sinus syndrome: I49.5

## 2019-09-19 SURGERY — ESOPHAGOGASTRODUODENOSCOPY (EGD) WITH PROPOFOL
Anesthesia: General

## 2019-09-19 MED ORDER — PROPOFOL 500 MG/50ML IV EMUL
INTRAVENOUS | Status: DC | PRN
  Administered 2019-09-19: 140 ug/kg/min via INTRAVENOUS

## 2019-09-19 MED ORDER — EPHEDRINE SULFATE 50 MG/ML IJ SOLN
INTRAMUSCULAR | Status: DC | PRN
  Administered 2019-09-19: 5 mg via INTRAVENOUS

## 2019-09-19 MED ORDER — SODIUM CHLORIDE 0.9 % IV SOLN: INTRAVENOUS | Status: DC

## 2019-09-19 MED ORDER — LIDOCAINE HCL (CARDIAC) PF 100 MG/5ML IV SOSY
PREFILLED_SYRINGE | INTRAVENOUS | Status: DC | PRN
  Administered 2019-09-19: 100 mg via INTRATRACHEAL

## 2019-09-19 MED ORDER — GLYCOPYRROLATE 0.2 MG/ML IJ SOLN
INTRAMUSCULAR | Status: DC | PRN
  Administered 2019-09-19: .2 mg via INTRAVENOUS

## 2019-09-19 MED ORDER — PROPOFOL 10 MG/ML IV BOLUS
INTRAVENOUS | Status: DC | PRN
  Administered 2019-09-19: 10 mg via INTRAVENOUS
  Administered 2019-09-19: 40 mg via INTRAVENOUS

## 2019-09-19 NOTE — Anesthesia Preprocedure Evaluation (Signed)
Anesthesia Evaluation  Patient identified by MRN, date of birth, ID band Patient awake    Reviewed: Allergy & Precautions, H&P , NPO status , Patient's Chart, lab work & pertinent test results, reviewed documented beta blocker date and time   History of Anesthesia Complications Negative for: history of anesthetic complications  Airway Mallampati: III  TM Distance: >3 FB Neck ROM: full    Dental  (+) Dental Advidsory Given, Edentulous Upper, Edentulous Lower   Pulmonary neg shortness of breath, asthma , neg sleep apnea, neg recent URI,    Pulmonary exam normal        Cardiovascular Exercise Tolerance: Good hypertension, (-) angina+ CAD, + Past MI and + CABG  (-) Cardiac Stents Normal cardiovascular exam+ dysrhythmias + pacemaker (-) Valvular Problems/Murmurs     Neuro/Psych PSYCHIATRIC DISORDERS Dementia negative neurological ROS     GI/Hepatic Neg liver ROS, GERD  ,  Endo/Other  negative endocrine ROS  Renal/GU negative Renal ROS  negative genitourinary   Musculoskeletal   Abdominal   Peds  Hematology negative hematology ROS (+)   Anesthesia Other Findings Past Medical History: No date: Alzheimer's disease ([place]) No date: Arthritis No date: Asthma No date: B12 deficiency No date: Bleeding disorder (HCC) No date: Coronary artery disease No date: Dysrhythmia     Comment:  atrial flutter No date: GERD (gastroesophageal reflux disease) No date: Hyperlipidemia No date: Hypertension No date: NSVT (nonsustained ventricular tachycardia) ([place]) 1990: Prostate cancer ([place])     Comment:  Takes Lupron shots No date: Sinus node dysfunction (HCC)   Reproductive/Obstetrics negative OB ROS                             Anesthesia Physical Anesthesia Plan  ASA: III  Anesthesia Plan: General   Post-op Pain Management:    Induction: Intravenous  PONV Risk Score and Plan: 2 and Propofol  infusion and TIVA  Airway Management Planned: Natural Airway and Nasal Cannula  Additional Equipment:   Intra-op Plan:   Post-operative Plan:   Informed Consent: I have reviewed the patients History and Physical, chart, labs and discussed the procedure including the risks, benefits and alternatives for the proposed anesthesia with the patient or authorized representative who has indicated his/her understanding and acceptance.     Dental Advisory Given  Plan Discussed with: Anesthesiologist, CRNA and Surgeon  Anesthesia Plan Comments:         Anesthesia Quick Evaluation

## 2019-09-19 NOTE — Transfer of Care (Signed)
Immediate Anesthesia Transfer of Care Note  Patient: Harry Vargas  Procedure(s) Performed: ESOPHAGOGASTRODUODENOSCOPY (EGD) WITH PROPOFOL (N/A ) COLONOSCOPY WITH PROPOFOL (N/A )  Patient Location: Endoscopy Unit  Anesthesia Type:General  Level of Consciousness: patient cooperative and responds to stimulation  Airway & Oxygen Therapy: Patient Spontanous Breathing and Patient connected to face mask oxygen  Post-op Assessment: Report given to RN and Post -op Vital signs reviewed and stable  Post vital signs: Reviewed and stable  Last Vitals:  Vitals Value Taken Time  BP    Temp    Pulse 66 09/19/19 1006  Resp 14 09/19/19 1006  SpO2 97 % 09/19/19 1006  Vitals shown include unvalidated device data.  Last Pain:  Vitals:   09/19/19 0844  TempSrc: Temporal  PainSc: 0-No pain         Complications: No apparent anesthesia complications

## 2019-09-19 NOTE — Op Note (Addendum)
[facility] Gastroenterology Patient Name: Harry Vargas Procedure Date: 09/19/2019 9:26 AM MRN: [id number] Account #: [id number] Date of Birth: 03-Jun-1929 Admit Type: Outpatient Age: 86 Room: [facility] ENDO ROOM 3 Gender: Male Note Status: Supervisor Override Procedure:             Upper GI endoscopy Indications:           Periumbilical abdominal pain, Hematochezia, Abnormal                         CT of the GI tract Providers:             [name]. [name] MD, MD Referring MD:          Rusty Aus, MD (Referring MD) Medicines:             Propofol per Anesthesia Complications:         No immediate complications. Procedure:             Pre-Anesthesia Assessment:                        - The risks and benefits of the procedure and the                         sedation options and risks were discussed with the                         patient. All questions were answered and informed                         consent was obtained.                        - Patient identification and proposed procedure were                         verified prior to the procedure by the nurse. The                         procedure was verified in the procedure room.                        - ASA Grade Assessment: III - A patient with severe                         systemic disease.                        - After reviewing the risks and benefits, the patient                         was deemed in satisfactory condition to undergo the                         procedure.                        After obtaining informed consent, the endoscope was                         passed under direct  vision. Throughout the procedure,                         the patient's blood pressure, pulse, and oxygen                         saturations were monitored continuously. The Endoscope                         was introduced through the mouth, and advanced to the                         third part of duodenum.  The upper GI endoscopy was                         accomplished without difficulty. The patient tolerated                         the procedure well. Findings:      Moderate tortuosity of the mid to distal esophagus was noted compatible       with a diagnosis of Presbyesophagus.      The Z-line was regular.      Patchy minimal inflammation characterized by erythema was found in the       gastric antrum.      A medium-sized hiatal hernia was present.      The examined duodenum was normal.      The exam was otherwise without abnormality. Impression:            - Z-line regular.                        - Gastritis.                        - Medium-sized hiatal hernia.                        - Normal examined duodenum.                        - The examination was otherwise normal.                        - No specimens collected. Recommendation:        - Proceed with colonoscopy Procedure Code(s):     --- Professional ---                        [phone], Esophagogastroduodenoscopy, flexible,                         transoral; diagnostic, including collection of                         specimen(s) by brushing or washing, when performed                         (separate procedure) Diagnosis Code(s):     --- Professional ---                        R93.3, Abnormal findings on  diagnostic imaging of                         other parts of digestive tract                        K92.1, Melena (includes Hematochezia)                        [id number], Periumbilical pain                        K44.9, Diaphragmatic hernia without obstruction or                         gangrene                        K29.70, Gastritis, unspecified, without bleeding CPT copyright 2019 American Medical Association. All rights reserved. The codes documented in this report are preliminary and upon coder review may  be revised to meet current compliance requirements. Efrain Sella MD, MD 09/19/2019 9:45:07 AM This report has  been signed electronically. Number of Addenda: 0 Note Initiated On: 09/19/2019 9:26 AM Estimated Blood Loss:  Estimated blood loss: none.      [facility]

## 2019-09-19 NOTE — Anesthesia Postprocedure Evaluation (Signed)
Anesthesia Post Note  Patient: Harry Vargas  Procedure(s) Performed: ESOPHAGOGASTRODUODENOSCOPY (EGD) WITH PROPOFOL (N/A ) COLONOSCOPY WITH PROPOFOL (N/A )  Patient location during evaluation: Endoscopy Anesthesia Type: General Level of consciousness: awake and alert Pain management: pain level controlled Vital Signs Assessment: post-procedure vital signs reviewed and stable Respiratory status: spontaneous breathing, nonlabored ventilation, respiratory function stable and patient connected to nasal cannula oxygen Cardiovascular status: blood pressure returned to baseline and stable Postop Assessment: no apparent nausea or vomiting Anesthetic complications: no     Last Vitals:  Vitals:   09/19/19 1026 09/19/19 1036  BP: 124/73 (!) 144/85  Pulse: 74 75  Resp: 19 15  Temp:    SpO2: 98% 100%    Last Pain:  Vitals:   09/19/19 1026  TempSrc:   PainSc: 0-No pain                 Martha Clan

## 2019-09-19 NOTE — Op Note (Addendum)
[facility] Gastroenterology Patient Name: Harry Vargas Procedure Date: 09/19/2019 9:25 AM MRN: [id number] Account #: [id number] Date of Birth: [birth year] Admit Type: Outpatient Age: 86 Room: [facility] ENDO ROOM 3 Gender: Male Note Status: Supervisor Override Procedure:             Colonoscopy Indications:           Abnormal CT, Change in bowel habits, Constipation,                         Recta bleeding Providers:             [name]. [name] MD, MD Referring MD:          Rusty Aus, MD (Referring MD) Medicines:             Propofol per Anesthesia Complications:         No immediate complications. Procedure:             Pre-Anesthesia Assessment:                        - The risks and benefits of the procedure and the                         sedation options and risks were discussed with the                         patient. All questions were answered and informed                         consent was obtained.                        - Patient identification and proposed procedure were                         verified prior to the procedure by the nurse. The                         procedure was verified in the procedure room.                        - ASA Grade Assessment: III - A patient with severe                         systemic disease.                        - After reviewing the risks and benefits, the patient                         was deemed in satisfactory condition to undergo the                         procedure.                        After obtaining informed consent, the colonoscope was                         passed under direct vision. Throughout the  procedure,                         the patient's blood pressure, pulse, and oxygen                         saturations were monitored continuously. The                         Colonoscope was introduced through the anus and                         advanced to the the cecum, identified by appendiceal                          orifice and ileocecal valve. The colonoscopy was                         performed without difficulty. The patient tolerated                         the procedure well. The quality of the bowel                         preparation was good. The ileocecal valve, appendiceal                         orifice, and rectum were photographed. Findings:      The perianal and digital rectal examinations were normal. Pertinent       negatives include normal sphincter tone and no palpable rectal lesions.      Multiple small and large-mouthed diverticula were found in the sigmoid       colon.      Two sessile polyps were found in the sigmoid colon, proximal sigmoid       colon and descending colon. The polyps were 3 to 5 mm in size. These       polyps were removed with a jumbo cold forceps. Resection and retrieval       were complete.      Non-bleeding internal hemorrhoids were found during retroflexion. The       hemorrhoids were Grade I (internal hemorrhoids that do not prolapse).      The exam was otherwise without abnormality. Impression:            - Diverticulosis in the sigmoid colon.                        - Two 3 to 5 mm polyps in the sigmoid colon, in the                         proximal sigmoid colon and in the descending colon,                         removed with a jumbo cold forceps. Resected and                         retrieved.                        - Non-bleeding  internal hemorrhoids.                        - The examination was otherwise normal. Recommendation:        - Patient has a contact number available for                         emergencies. The signs and symptoms of potential                         delayed complications were discussed with the patient.                         Return to normal activities tomorrow. Written                         discharge instructions were provided to the patient.                        - Resume previous diet.                         - Continue present medications.                        - Await pathology results.                        - Repeat colonoscopy date to be determined after                         pending pathology results are reviewed for                         surveillance.                        - Return to GI office PRN. Procedure Code(s):     --- Professional ---                        [phone], Colonoscopy, flexible; with biopsy, single or                         multiple Diagnosis Code(s):     --- Professional ---                        K57.30, Diverticulosis of large intestine without                         perforation or abscess without bleeding                        K63.5, Polyp of colon                        K64.0, First degree hemorrhoids                        Z86.010, Personal history of colonic polyps CPT copyright 2019 American Medical Association. All rights reserved. The codes documented in this report are preliminary and upon  coder review may  be revised to meet current compliance requirements. Efrain Sella MD, MD 09/19/2019 10:06:49 AM This report has been signed electronically. Number of Addenda: 0 Note Initiated On: 09/19/2019 9:25 AM Scope Withdrawal Time: 0 hours 8 minutes 40 seconds  Total Procedure Duration: 0 hours 14 minutes 46 seconds  Estimated Blood Loss:  Estimated blood loss: none.      [facility]

## 2019-09-19 NOTE — Interval H&P Note (Signed)
History and Physical Interval Note:  09/19/2019 9:28 AM  Harry Vargas  has presented today for surgery, with the diagnosis of ABNORMAL CT, BOWEL HABIT CHANGES, RECTAL BLEEDING.  The various methods of treatment have been discussed with the patient and family. After consideration of risks, benefits and other options for treatment, the patient has consented to  Procedure(s) with comments: ESOPHAGOGASTRODUODENOSCOPY (EGD) WITH PROPOFOL (N/A) COLONOSCOPY WITH PROPOFOL (N/A) - COVID - 19 done at [facility], report on chart as a surgical intervention.  The patient's history has been reviewed, patient examined, no change in status, stable for surgery.  I have reviewed the patient's chart and labs.  Questions were answered to the patient's satisfaction.     [place], [place]

## 2019-09-19 NOTE — H&P (Signed)
Outpatient short stay form Pre-procedure 09/19/2019 9:26 AM Harry Vargas K. [name], M.D.  Primary Physician: [name], M.D.  Reason for visit:  constipation/ bowel habit changes/rectal bleeding/ abnormal ct of the GI tract.   History of present illness:  86 y/o male presents for abdominal pain, rectal bleeding and recent CT of the GI tract showing circumferential wall thickening of the sigmoid colon, large hiatal hernia with esophageal wall thickening. Bloodwork including cbc, cmp, lipase pt/inre, PTT all unremarkable.     Current Facility-Administered Medications:  .  0.9 %  sodium chloride infusion, , Intravenous, Continuous, [place], [name], MD, Last Rate: 20 mL/hr at 09/19/19 0902, New Bag at 09/19/19 0902  Facility-Administered Medications Ordered in Other Encounters:  .  glycopyrrolate (ROBINUL) injection, , Intravenous, Anesthesia Intra-op, Fletcher-Harrison, [name], CRNA, 0.2 mg at 09/19/19 0913  Medications Prior to Admission  Medication Sig Dispense Refill Last Dose  . acetaminophen (TYLENOL) 650 MG CR tablet Take 1,300 mg by mouth every 8 (eight) hours as needed.    Past Week at Unknown time  . Apple Cider Vinegar 600 MG CAPS Take 600 mg by mouth 3 (three) times daily.    09/18/2019 at 1200  . aspirin EC 81 MG tablet Take 81 mg by mouth daily.    Past Week at Unknown time  . Calcium 600-200 MG-UNIT tablet Take 1 tablet by mouth 2 (two) times daily with a meal.   09/18/2019 at 0800  . donepezil (ARICEPT) 5 MG tablet Take 5 mg by mouth at bedtime.   09/18/2019 at 0800  . Leuprolide Acetate, 6 Month, (LUPRON DEPOT, 75-MONTH,) 45 MG injection Inject 45 mg into the muscle every 6 (six) months.   Past Month at Unknown time  . polyethylene glycol powder (GLYCOLAX/MIRALAX) powder Take 17 g by mouth daily.    Past Week at Unknown time  . pravastatin (PRAVACHOL) 20 MG tablet Take 20 mg by mouth every evening.    09/18/2019 at Unknown time  . vitamin B-12 (CYANOCOBALAMIN) 1000 MCG tablet Take  1,000 mcg by mouth daily.   09/18/2019 at Unknown time  . clonazePAM (KLONOPIN) 1 MG tablet Take 1 mg by mouth at bedtime.    Not Taking at Unknown time     No Known Allergies   Past Medical History:  Diagnosis Date  . Alzheimer's disease ([place])   . Arthritis   . Asthma   . B12 deficiency   . Bleeding disorder ([place])   . Coronary artery disease   . Dysrhythmia    atrial flutter  . GERD (gastroesophageal reflux disease)   . Hyperlipidemia   . Hypertension   . NSVT (nonsustained ventricular tachycardia) ([place])   . Prostate cancer ([place]) 1990   Takes Lupron shots  . Sinus node dysfunction (HCC)     Review of systems:  Otherwise negative.    Physical Exam  Gen: Alert, oriented. Appears stated age.  HEENT: /AT. PERRLA. Lungs: CTA, no wheezes. CV: RR nl S1, S2. Abd: soft, benign, no masses. BS+ Ext: No edema. Pulses 2+    Planned procedures: Proceed with EGD and colonoscopy. The patient understands the nature of the planned procedure, indications, risks, alternatives and potential complications including but not limited to bleeding, infection, perforation, damage to internal organs and possible oversedation/side effects from anesthesia. The patient agrees and gives consent to proceed.  Please refer to procedure notes for findings, recommendations and patient disposition/instructions.     [name] K. [name], M.D. Gastroenterology 09/19/2019  9:26 AM

## 2019-09-20 ENCOUNTER — Encounter: Payer: Self-pay | Admitting: *Deleted

## 2019-09-20 LAB — SURGICAL PATHOLOGY

## 2020-01-31 ENCOUNTER — Other Ambulatory Visit: Payer: Self-pay | Admitting: Family Medicine

## 2020-01-31 ENCOUNTER — Other Ambulatory Visit: Payer: Self-pay

## 2020-01-31 ENCOUNTER — Ambulatory Visit
Admission: RE | Admit: 2020-01-31 | Discharge: 2020-01-31 | Disposition: A | Discharge status: Home / Self Care | Payer: Medicare Other | Source: Ambulatory Visit | Attending: Family Medicine | Admitting: Family Medicine

## 2020-01-31 ENCOUNTER — Encounter (INDEPENDENT_AMBULATORY_CARE_PROVIDER_SITE_OTHER): Payer: Self-pay

## 2020-01-31 DIAGNOSIS — M7989 Other specified soft tissue disorders: Secondary | ICD-10-CM | POA: Diagnosis present

## 2020-01-31 DIAGNOSIS — M79662 Pain in left lower leg: Secondary | ICD-10-CM | POA: Diagnosis not present

## 2020-02-14 DIAGNOSIS — M818 Other osteoporosis without current pathological fracture: Secondary | ICD-10-CM | POA: Insufficient documentation

## 2021-01-21 ENCOUNTER — Other Ambulatory Visit: Payer: Self-pay | Admitting: Physician Assistant

## 2021-01-21 ENCOUNTER — Ambulatory Visit
Admission: RE | Admit: 2021-01-21 | Discharge: 2021-01-21 | Disposition: A | Discharge status: Home / Self Care | Payer: Medicare Other | Source: Ambulatory Visit | Attending: Physician Assistant | Admitting: Physician Assistant

## 2021-01-21 ENCOUNTER — Other Ambulatory Visit: Payer: Self-pay

## 2021-01-21 DIAGNOSIS — I619 Nontraumatic intracerebral hemorrhage, unspecified: Secondary | ICD-10-CM | POA: Diagnosis present

## 2021-01-21 DIAGNOSIS — M79605 Pain in left leg: Secondary | ICD-10-CM | POA: Diagnosis not present

## 2021-01-21 DIAGNOSIS — R253 Fasciculation: Secondary | ICD-10-CM | POA: Diagnosis not present

## 2021-01-21 DIAGNOSIS — G629 Polyneuropathy, unspecified: Secondary | ICD-10-CM | POA: Diagnosis not present

## 2021-01-21 DIAGNOSIS — W19XXXA Unspecified fall, initial encounter: Secondary | ICD-10-CM | POA: Diagnosis not present

## 2021-01-21 DIAGNOSIS — H9190 Unspecified hearing loss, unspecified ear: Secondary | ICD-10-CM | POA: Insufficient documentation

## 2021-01-21 DIAGNOSIS — R413 Other amnesia: Secondary | ICD-10-CM | POA: Diagnosis not present

## 2021-10-10 ENCOUNTER — Emergency Department: Payer: Medicare Other

## 2021-10-10 ENCOUNTER — Emergency Department
Admission: EM | Admit: 2021-10-10 | Discharge: 2021-10-10 | Disposition: A | Discharge status: Home / Self Care | Payer: Medicare Other | Attending: Emergency Medicine | Admitting: Emergency Medicine

## 2021-10-10 DIAGNOSIS — F03918 Unspecified dementia, unspecified severity, with other behavioral disturbance: Secondary | ICD-10-CM | POA: Insufficient documentation

## 2021-10-10 DIAGNOSIS — F22 Delusional disorders: Secondary | ICD-10-CM | POA: Insufficient documentation

## 2021-10-10 DIAGNOSIS — D72829 Elevated white blood cell count, unspecified: Secondary | ICD-10-CM | POA: Diagnosis not present

## 2021-10-10 DIAGNOSIS — W19XXXA Unspecified fall, initial encounter: Secondary | ICD-10-CM | POA: Diagnosis not present

## 2021-10-10 DIAGNOSIS — R462 Strange and inexplicable behavior: Secondary | ICD-10-CM | POA: Diagnosis present

## 2021-10-10 DIAGNOSIS — S0990XA Unspecified injury of head, initial encounter: Secondary | ICD-10-CM | POA: Diagnosis present

## 2021-10-10 LAB — CBC
HCT: 51.2 % (ref 39.0–52.0)
Hemoglobin: 15.5 g/dL (ref 13.0–17.0)
MCH: 25.2 pg — ABNORMAL LOW (ref 26.0–34.0)
MCHC: 30.3 g/dL (ref 30.0–36.0)
MCV: 83.1 fL (ref 80.0–100.0)
Platelets: 147 10*3/uL — ABNORMAL LOW (ref 150–400)
RBC: 6.16 MIL/uL — ABNORMAL HIGH (ref 4.22–5.81)
RDW: 15.3 % (ref 11.5–15.5)
WBC: 10.8 10*3/uL — ABNORMAL HIGH (ref 4.0–10.5)
nRBC: 0 % (ref 0.0–0.2)

## 2021-10-10 LAB — COMPREHENSIVE METABOLIC PANEL
ALT: 14 U/L (ref 0–44)
AST: 24 U/L (ref 15–41)
Albumin: 3.2 g/dL — ABNORMAL LOW (ref 3.5–5.0)
Alkaline Phosphatase: 68 U/L (ref 38–126)
Anion gap: 9 (ref 5–15)
BUN: 19 mg/dL (ref 8–23)
CO2: 30 mmol/L (ref 22–32)
Calcium: 8.9 mg/dL (ref 8.9–10.3)
Chloride: 99 mmol/L (ref 98–111)
Creatinine, Ser: 1.03 mg/dL (ref 0.61–1.24)
GFR, Estimated: >60 mL/min (ref >60)
Glucose, Bld: 97 mg/dL (ref 70–99)
Potassium: 4.2 mmol/L (ref 3.5–5.1)
Sodium: 138 mmol/L (ref 135–145)
Total Bilirubin: 1 mg/dL (ref 0.3–1.2)
Total Protein: 6.9 g/dL (ref 6.5–8.1)

## 2021-10-10 MED ORDER — LORAZEPAM 1 MG PO TABS: 1.0000 mg | ORAL_TABLET | Freq: Every day | ORAL | 0 refills | Status: DC

## 2021-10-10 NOTE — ED Triage Notes (Signed)
Per ems/family, unknown confusion times , blood sugar 130, from home, dementia, per pt he fell yesterday ?

## 2021-10-10 NOTE — ED Provider Notes (Signed)
? ?[facility] ?Provider Note ? ? ? Event Date/Time  ? First MD Initiated Contact with Patient 10/10/21 1207   ?  (approximate) ? ? ?History  ? ?Possible fall, dementia ? ?HPI ? ?Harry Vargas is a 88 y.o. male with a history of dementia who was unable to provide significant history sent in for reported abnormal behavior.  Daughter notes that patient seems to have had increased paranoia and abnormal behavior over the last few weeks.  She does not think that he fell.  She is the primary caregiver for him.  No reports of cough or fevers.  Patient has no complaints ?  ? ? ?Physical Exam  ? ?Triage Vital Signs: ?ED Triage Vitals  ?Enc Vitals Group  ?   BP 10/10/21 1207 135/74  ?   Pulse Rate 10/10/21 1207 72  ?   Resp 10/10/21 1207 17  ?   Temp 10/10/21 1207 98.5 ?F (36.9 ?C)  ?   Temp Source 10/10/21 1207 Oral  ?   SpO2 10/10/21 1207 98 %  ?   Weight 10/10/21 1209 75 kg (165 lb 5.5 oz)  ?   Height 10/10/21 1209 1.753 m ('5\' 9"'$ )  ?   Head Circumference --   ?   Peak Flow --   ?   Pain Score 10/10/21 1209 0  ?   Pain Loc --   ?   Pain Edu? --   ?   Excl. in [place]? --   ? ? ?Most recent vital signs: ?Vitals:  ? 10/10/21 1313 10/10/21 1520  ?BP: 130/77 131/77  ?Pulse: 79   ?Resp: (!) 22 17  ?Temp: 98.2 ?F (36.8 ?C) 98.5 ?F (36.9 ?C)  ?SpO2: 94% 98%  ? ? ? ?General: Awake, no distress.  ?CV:  Good peripheral perfusion.  Regular rate and rhythm ?Resp:  Normal effort.  Clear to auscultation bilaterally ?Abd:  No distention.  ?Other:  Moves all extremities, neuro exam is normal ? ? ?ED Results / Procedures / Treatments  ? ?Labs ?(all labs ordered are listed, but only abnormal results are displayed) ?Labs Reviewed  ?CBC - Abnormal; Notable for the following components:  ?    Result Value  ? WBC 10.8 (*)   ? RBC 6.16 (*)   ? MCH 25.2 (*)   ? Platelets 147 (*)   ? All other components within normal limits  ?COMPREHENSIVE METABOLIC PANEL - Abnormal; Notable for the following components:  ? Albumin 3.2 (*)   ?  All other components within normal limits  ?URINALYSIS, COMPLETE (UACMP) WITH MICROSCOPIC  ? ? ? ?EKG ? ?ED ECG REPORT ?I, Lavonia Drafts, the attending physician, personally viewed and interpreted this ECG. ? ?Date: 10/10/2021 ? ?Rhythm: normal sinus rhythm ?QRS Axis: normal ?Intervals: normal ?ST/T Wave abnormalities: normal ?Narrative Interpretation: no evidence of acute ischemia ? ? ? ?RADIOLOGY ?CT head viewed by me, no acute abnormality ?Chest x-ray viewed by me, no acute abnormality, no pneumonia ? ? ?PROCEDURES: ? ?Critical Care performed:  ? ?Procedures ? ? ?MEDICATIONS ORDERED IN ED: ?Medications - No data to display ? ? ?IMPRESSION / MDM / ASSESSMENT AND PLAN / ED COURSE  ?I reviewed the triage vital signs and the nursing notes. ? ? ?Patient with a history of dementia presents with reported gradual change in mental status. ? ?Overall the patient is well-appearing and calm here in the emergency department.  Differential includes infectious etiology, worsening dementia, less likely traumatic injury or stroke ? ?CT  head is reassuring, white blood cell count is very minimally elevated, CMP is unremarkable. ? ?Chest x-ray without evidence of pneumonia. ? ?Discussed with daughter at length, do feel his symptoms are related to worsening dementia, she has follow-up with neurology tomorrow.  Will prescribe course of Ativan as the patient is having difficulty sleeping ? ?Considered admission however no treatable diagnosis at this time, appropriate for discharge with outpatient follow-up ? ? ? ? ?  ? ? ?FINAL CLINICAL IMPRESSION(S) / ED DIAGNOSES  ? ?Final diagnoses:  ?Dementia with other behavioral disturbance, unspecified dementia severity, unspecified dementia type  ? ? ? ?Rx / DC Orders  ? ?ED Discharge Orders   ? ?      Ordered  ?  LORazepam (ATIVAN) 1 MG tablet  Daily at bedtime       ? 10/10/21 1430  ? ?  ?  ? ?  ? ? ? ?Note:  This document was prepared using Dragon voice recognition software and may include  unintentional dictation errors. ?  ?Lavonia Drafts, MD ?10/10/21 1524 ? ?

## 2021-10-16 ENCOUNTER — Inpatient Hospital Stay
Admission: EM | Admit: 2021-10-16 | Discharge: 2021-10-21 | DRG: 065 | Disposition: A | Discharge status: Skilled Nursing Facility | Payer: Medicare Other | Attending: Internal Medicine | Admitting: Internal Medicine

## 2021-10-16 ENCOUNTER — Emergency Department: Payer: Medicare Other

## 2021-10-16 ENCOUNTER — Other Ambulatory Visit: Payer: Self-pay

## 2021-10-16 DIAGNOSIS — J45909 Unspecified asthma, uncomplicated: Secondary | ICD-10-CM | POA: Diagnosis present

## 2021-10-16 DIAGNOSIS — Z79818 Long term (current) use of other agents affecting estrogen receptors and estrogen levels: Secondary | ICD-10-CM

## 2021-10-16 DIAGNOSIS — E440 Moderate protein-calorie malnutrition: Secondary | ICD-10-CM | POA: Diagnosis present

## 2021-10-16 DIAGNOSIS — M542 Cervicalgia: Secondary | ICD-10-CM | POA: Diagnosis present

## 2021-10-16 DIAGNOSIS — I639 Cerebral infarction, unspecified: Principal | ICD-10-CM | POA: Diagnosis present

## 2021-10-16 DIAGNOSIS — W19XXXA Unspecified fall, initial encounter: Secondary | ICD-10-CM | POA: Diagnosis present

## 2021-10-16 DIAGNOSIS — Z951 Presence of aortocoronary bypass graft: Secondary | ICD-10-CM

## 2021-10-16 DIAGNOSIS — I2510 Atherosclerotic heart disease of native coronary artery without angina pectoris: Secondary | ICD-10-CM | POA: Diagnosis not present

## 2021-10-16 DIAGNOSIS — I4892 Unspecified atrial flutter: Secondary | ICD-10-CM | POA: Diagnosis present

## 2021-10-16 DIAGNOSIS — Z66 Do not resuscitate: Secondary | ICD-10-CM | POA: Diagnosis present

## 2021-10-16 DIAGNOSIS — I10 Essential (primary) hypertension: Secondary | ICD-10-CM | POA: Diagnosis present

## 2021-10-16 DIAGNOSIS — G9349 Other encephalopathy: Secondary | ICD-10-CM | POA: Diagnosis present

## 2021-10-16 DIAGNOSIS — R4182 Altered mental status, unspecified: Secondary | ICD-10-CM

## 2021-10-16 DIAGNOSIS — I6521 Occlusion and stenosis of right carotid artery: Secondary | ICD-10-CM | POA: Diagnosis present

## 2021-10-16 DIAGNOSIS — Z8546 Personal history of malignant neoplasm of prostate: Secondary | ICD-10-CM

## 2021-10-16 DIAGNOSIS — I110 Hypertensive heart disease with heart failure: Secondary | ICD-10-CM | POA: Diagnosis present

## 2021-10-16 DIAGNOSIS — R1310 Dysphagia, unspecified: Secondary | ICD-10-CM | POA: Diagnosis present

## 2021-10-16 DIAGNOSIS — E663 Overweight: Secondary | ICD-10-CM | POA: Diagnosis present

## 2021-10-16 DIAGNOSIS — Z7982 Long term (current) use of aspirin: Secondary | ICD-10-CM

## 2021-10-16 DIAGNOSIS — R29810 Facial weakness: Secondary | ICD-10-CM | POA: Diagnosis present

## 2021-10-16 DIAGNOSIS — F0280 Dementia in other diseases classified elsewhere without behavioral disturbance: Secondary | ICD-10-CM | POA: Diagnosis present

## 2021-10-16 DIAGNOSIS — E785 Hyperlipidemia, unspecified: Secondary | ICD-10-CM | POA: Diagnosis present

## 2021-10-16 DIAGNOSIS — F0282 Dementia in other diseases classified elsewhere, unspecified severity, with psychotic disturbance: Secondary | ICD-10-CM | POA: Diagnosis present

## 2021-10-16 DIAGNOSIS — R410 Disorientation, unspecified: Secondary | ICD-10-CM | POA: Diagnosis present

## 2021-10-16 DIAGNOSIS — R400 Somnolence: Secondary | ICD-10-CM | POA: Diagnosis present

## 2021-10-16 DIAGNOSIS — Z20822 Contact with and (suspected) exposure to covid-19: Secondary | ICD-10-CM | POA: Diagnosis present

## 2021-10-16 DIAGNOSIS — I5042 Chronic combined systolic (congestive) and diastolic (congestive) heart failure: Secondary | ICD-10-CM | POA: Diagnosis present

## 2021-10-16 DIAGNOSIS — F02818 Dementia in other diseases classified elsewhere, unspecified severity, with other behavioral disturbance: Secondary | ICD-10-CM | POA: Diagnosis present

## 2021-10-16 DIAGNOSIS — Z79899 Other long term (current) drug therapy: Secondary | ICD-10-CM

## 2021-10-16 DIAGNOSIS — M5030 Other cervical disc degeneration, unspecified cervical region: Secondary | ICD-10-CM | POA: Diagnosis present

## 2021-10-16 DIAGNOSIS — Z950 Presence of cardiac pacemaker: Secondary | ICD-10-CM

## 2021-10-16 DIAGNOSIS — Z6826 Body mass index (BMI) 26.0-26.9, adult: Secondary | ICD-10-CM

## 2021-10-16 DIAGNOSIS — R29703 NIHSS score 3: Secondary | ICD-10-CM | POA: Diagnosis present

## 2021-10-16 DIAGNOSIS — K219 Gastro-esophageal reflux disease without esophagitis: Secondary | ICD-10-CM | POA: Diagnosis present

## 2021-10-16 DIAGNOSIS — G301 Alzheimer's disease with late onset: Secondary | ICD-10-CM | POA: Diagnosis present

## 2021-10-16 DIAGNOSIS — G309 Alzheimer's disease, unspecified: Secondary | ICD-10-CM | POA: Diagnosis present

## 2021-10-16 DIAGNOSIS — I495 Sick sinus syndrome: Secondary | ICD-10-CM | POA: Diagnosis present

## 2021-10-16 LAB — URINALYSIS, ROUTINE W REFLEX MICROSCOPIC
Bacteria, UA: NONE SEEN
Bilirubin Urine: NEGATIVE
Glucose, UA: NEGATIVE mg/dL
Hgb urine dipstick: NEGATIVE
Ketones, ur: NEGATIVE mg/dL
Leukocytes,Ua: NEGATIVE
Nitrite: NEGATIVE
Protein, ur: 30 mg/dL — AB
Specific Gravity, Urine: >1.046 — ABNORMAL HIGH (ref 1.005–1.030)
Squamous Epithelial / HPF: NONE SEEN (ref 0–5)
pH: 7 (ref 5.0–8.0)

## 2021-10-16 LAB — CBC WITH DIFFERENTIAL/PLATELET
Abs Immature Granulocytes: 0.09 10*3/uL — ABNORMAL HIGH (ref 0.00–0.07)
Basophils Absolute: 0 10*3/uL (ref 0.0–0.1)
Basophils Relative: 0 %
Eosinophils Absolute: 0.3 10*3/uL (ref 0.0–0.5)
Eosinophils Relative: 2 %
HCT: 49.9 % (ref 39.0–52.0)
Hemoglobin: 15.1 g/dL (ref 13.0–17.0)
Immature Granulocytes: 1 %
Lymphocytes Relative: 8 %
Lymphs Abs: 1.1 10*3/uL (ref 0.7–4.0)
MCH: 25.5 pg — ABNORMAL LOW (ref 26.0–34.0)
MCHC: 30.3 g/dL (ref 30.0–36.0)
MCV: 84.1 fL (ref 80.0–100.0)
Monocytes Absolute: 0.8 10*3/uL (ref 0.1–1.0)
Monocytes Relative: 6 %
Neutro Abs: 10.6 10*3/uL — ABNORMAL HIGH (ref 1.7–7.7)
Neutrophils Relative %: 83 %
Platelets: 183 10*3/uL (ref 150–400)
RBC: 5.93 MIL/uL — ABNORMAL HIGH (ref 4.22–5.81)
RDW: 15.5 % (ref 11.5–15.5)
WBC: 12.9 10*3/uL — ABNORMAL HIGH (ref 4.0–10.5)
nRBC: 0 % (ref 0.0–0.2)

## 2021-10-16 LAB — T4, FREE: Free T4: 1.21 ng/dL — ABNORMAL HIGH (ref 0.61–1.12)

## 2021-10-16 LAB — PROTIME-INR
INR: 1.1 (ref 0.8–1.2)
Prothrombin Time: 13.8 s (ref 11.4–15.2)

## 2021-10-16 LAB — COMPREHENSIVE METABOLIC PANEL
ALT: 15 U/L (ref 0–44)
AST: 25 U/L (ref 15–41)
Albumin: 3.3 g/dL — ABNORMAL LOW (ref 3.5–5.0)
Alkaline Phosphatase: 76 U/L (ref 38–126)
Anion gap: 8 (ref 5–15)
BUN: 16 mg/dL (ref 8–23)
CO2: 36 mmol/L — ABNORMAL HIGH (ref 22–32)
Calcium: 8.8 mg/dL — ABNORMAL LOW (ref 8.9–10.3)
Chloride: 95 mmol/L — ABNORMAL LOW (ref 98–111)
Creatinine, Ser: 1 mg/dL (ref 0.61–1.24)
GFR, Estimated: >60 mL/min (ref >60)
Glucose, Bld: 108 mg/dL — ABNORMAL HIGH (ref 70–99)
Potassium: 3.6 mmol/L (ref 3.5–5.1)
Sodium: 139 mmol/L (ref 135–145)
Total Bilirubin: 0.9 mg/dL (ref 0.3–1.2)
Total Protein: 7.3 g/dL (ref 6.5–8.1)

## 2021-10-16 LAB — RESP PANEL BY RT-PCR (FLU A&B, COVID) ARPGX2
Influenza A by PCR: NEGATIVE
Influenza B by PCR: NEGATIVE
SARS Coronavirus 2 by RT PCR: NEGATIVE

## 2021-10-16 LAB — APTT: aPTT: 29 s (ref 24–36)

## 2021-10-16 LAB — CBC
HCT: 49.7 % (ref 39.0–52.0)
Hemoglobin: 15 g/dL (ref 13.0–17.0)
MCH: 25.3 pg — ABNORMAL LOW (ref 26.0–34.0)
MCHC: 30.2 g/dL (ref 30.0–36.0)
MCV: 84 fL (ref 80.0–100.0)
Platelets: 176 10*3/uL (ref 150–400)
RBC: 5.92 MIL/uL — ABNORMAL HIGH (ref 4.22–5.81)
RDW: 15.4 % (ref 11.5–15.5)
WBC: 12.5 10*3/uL — ABNORMAL HIGH (ref 4.0–10.5)
nRBC: 0 % (ref 0.0–0.2)

## 2021-10-16 LAB — ETHANOL: Alcohol, Ethyl (B): <10 mg/dL

## 2021-10-16 LAB — TSH
TSH: 3.873 u[IU]/mL (ref 0.350–4.500)
TSH: 3.512 u[IU]/mL (ref 0.350–4.500)

## 2021-10-16 LAB — LDL CHOLESTEROL, DIRECT: Direct LDL: 88.3 mg/dL (ref 0–99)

## 2021-10-16 LAB — CBG MONITORING, ED: Glucose-Capillary: 103 mg/dL — ABNORMAL HIGH (ref 70–99)

## 2021-10-16 MED ORDER — HEPARIN SODIUM (PORCINE) 5000 UNIT/ML IJ SOLN
5000.0000 [IU] | Freq: Three times a day (TID) | INTRAMUSCULAR | Status: DC
  Administered 2021-10-16 – 2021-10-17 (×3): 5000 [IU] via SUBCUTANEOUS
  Filled 2021-10-16 (×3): qty 1

## 2021-10-16 MED ORDER — IOHEXOL 350 MG/ML SOLN
75.0000 mL | Freq: Once | INTRAVENOUS | Status: AC | PRN
  Administered 2021-10-16: 75 mL via INTRAVENOUS

## 2021-10-16 MED ORDER — DONEPEZIL HCL 5 MG PO TABS
5.0000 mg | ORAL_TABLET | Freq: Every day | ORAL | Status: DC
  Administered 2021-10-16 – 2021-10-20 (×4): 5 mg via ORAL
  Filled 2021-10-16 (×4): qty 1

## 2021-10-16 MED ORDER — ATORVASTATIN CALCIUM 20 MG PO TABS
40.0000 mg | ORAL_TABLET | Freq: Every day | ORAL | Status: DC
  Administered 2021-10-16 – 2021-10-21 (×6): 40 mg via ORAL
  Filled 2021-10-16 (×6): qty 2

## 2021-10-16 MED ORDER — MEMANTINE HCL 5 MG PO TABS
10.0000 mg | ORAL_TABLET | Freq: Two times a day (BID) | ORAL | Status: DC
  Administered 2021-10-16 – 2021-10-21 (×9): 10 mg via ORAL
  Filled 2021-10-16 (×9): qty 2

## 2021-10-16 MED ORDER — ASPIRIN 81 MG PO CHEW
81.0000 mg | CHEWABLE_TABLET | Freq: Every day | ORAL | Status: DC
  Administered 2021-10-16 – 2021-10-21 (×6): 81 mg via ORAL
  Filled 2021-10-16 (×6): qty 1

## 2021-10-16 MED ORDER — SODIUM CHLORIDE 0.9 % IV SOLN: INTRAVENOUS | Status: DC

## 2021-10-16 MED ORDER — IPRATROPIUM BROMIDE 0.06 % NA SOLN
1.0000 | Freq: Two times a day (BID) | NASAL | Status: DC
  Administered 2021-10-19 – 2021-10-21 (×4): 1 via NASAL
  Filled 2021-10-16 (×2): qty 15

## 2021-10-16 MED ORDER — CLOPIDOGREL BISULFATE 75 MG PO TABS
75.0000 mg | ORAL_TABLET | Freq: Every day | ORAL | Status: DC
  Administered 2021-10-16 – 2021-10-21 (×6): 75 mg via ORAL
  Filled 2021-10-16 (×6): qty 1

## 2021-10-16 MED ORDER — GABAPENTIN 100 MG PO CAPS
100.0000 mg | ORAL_CAPSULE | Freq: Two times a day (BID) | ORAL | Status: DC
  Administered 2021-10-16 – 2021-10-17 (×2): 100 mg via ORAL
  Filled 2021-10-16 (×2): qty 1

## 2021-10-16 MED ORDER — HYDRALAZINE HCL 20 MG/ML IJ SOLN: 10.0000 mg | Freq: Three times a day (TID) | INTRAMUSCULAR | Status: DC | PRN

## 2021-10-16 MED ORDER — ACETAMINOPHEN 650 MG RE SUPP: 650.0000 mg | Freq: Four times a day (QID) | RECTAL | Status: DC | PRN

## 2021-10-16 MED ORDER — QUETIAPINE FUMARATE 25 MG PO TABS
25.0000 mg | ORAL_TABLET | Freq: Every day | ORAL | Status: DC
  Administered 2021-10-16: 25 mg via ORAL
  Filled 2021-10-16: qty 1

## 2021-10-16 MED ORDER — ACETAMINOPHEN 325 MG PO TABS
650.0000 mg | ORAL_TABLET | Freq: Four times a day (QID) | ORAL | Status: DC | PRN
  Administered 2021-10-18 (×2): 650 mg via ORAL
  Filled 2021-10-16 (×2): qty 2

## 2021-10-16 NOTE — Consult Note (Signed)
NEUROLOGY CONSULTATION NOTE  ? ?Date of service: October 16, 2021 ?Patient Name: Harry Vargas ?MRN:  [id number] ?DOB:  10-01-1928 ?Reason for consult: stroke code for R facial droop ?Requesting physician: Dr. Lavonia Drafts ?_ _ _   _ __   _ __ _ _  __ __   _ __   __ _ ? ?History of Present Illness  ? ?This is a 88 year old gentleman with past medical history significant for Alzheimer's disease advanced, B12 deficiency, CAD, hyperlipidemia, hypertension, prostate cancer who is brought in by EMS after a fall with acute onset of right facial droop.  Last known well was 12:30 PM today.  He has been having frequent falls including 1 today and one 1 week ago with possible head trauma but no persistent injuries.  EMS was called after his fall today and they noted him to have a right facial droop.  When they notified the daughter and she looked at it she did think that he had a new right facial droop.  He was therefore brought in for stroke evaluation.  Stroke code was called upon arrival to the emergency department.  CT head showed no acute intracranial process (personal review).  Exam was significant for NIH stroke scale of 3 for right facial droop and drift in his bilateral lower extremities.  Only the right facial droop was felt to be related to possible acute stroke and therefore TNK was not administered due to mild symptoms.  Exam not consistent with LVO therefore CTA was not performed as part of the stroke code. ?  ?ROS  ? ?Per HPI: all other systems reviewed w/ daughter and are negative ? ?Past History  ? ?I have reviewed the following: ? ?Past Medical History:  ?Diagnosis Date  ? Alzheimer's disease ([place])   ? Arthritis   ? Asthma   ? B12 deficiency   ? Bleeding disorder ([place])   ? Coronary artery disease   ? Dysrhythmia   ? atrial flutter  ? GERD (gastroesophageal reflux disease)   ? Hyperlipidemia   ? Hypertension   ? NSVT (nonsustained ventricular tachycardia)   ? Prostate cancer ([place]) 1990  ? Takes Lupron shots  ?  Sinus node dysfunction (HCC)   ? ?Past Surgical History:  ?Procedure Laterality Date  ? BYPASS GRAFT    ? CHOLECYSTECTOMY    ? CIRCUMCISION    ? COLONOSCOPY WITH PROPOFOL N/A 09/19/2019  ? Procedure: COLONOSCOPY WITH PROPOFOL;  Surgeon: [place], [name], MD;  Location: [facility] ENDOSCOPY;  Service: Gastroenterology;  Laterality: N/A;  COVID - 17 done at [facility], report on chart  ? CORONARY ARTERY BYPASS GRAFT    ? ESOPHAGOGASTRODUODENOSCOPY (EGD) WITH PROPOFOL N/A 09/19/2019  ? Procedure: ESOPHAGOGASTRODUODENOSCOPY (EGD) WITH PROPOFOL;  Surgeon: Toledo, [name], MD;  Location: [facility] ENDOSCOPY;  Service: Gastroenterology;  Laterality: N/A;  ? INSERT / REPLACE / REMOVE PACEMAKER    ? ?Family History  ?Problem Relation Age of Onset  ? Prostate cancer Neg Hx   ? Bladder Cancer Neg Hx   ? Kidney cancer Neg Hx   ? ?Social History  ? ?Socioeconomic History  ? Marital status: Widowed  ?  Spouse name: Not on file  ? Number of children: Not on file  ? Years of education: Not on file  ? Highest education level: Not on file  ?Occupational History  ? Not on file  ?Tobacco Use  ? Smoking status: Never  ? Smokeless tobacco: Never  ?Vaping Use  ? Vaping Use: Never used  ?  Substance and Sexual Activity  ? Alcohol use: No  ? Drug use: No  ? Sexual activity: Not on file  ?Other Topics Concern  ? Not on file  ?Social History Narrative  ? Not on file  ? ?Social Determinants of Health  ? ?Financial Resource Strain: Not on file  ?Food Insecurity: Not on file  ?Transportation Needs: Not on file  ?Physical Activity: Not on file  ?Stress: Not on file  ?Social Connections: Not on file  ? ?No Known Allergies ? ?Medications  ? ?(Not in a hospital admission) ?  ? ?No current facility-administered medications for this encounter. ? ?Current Outpatient Medications:  ?  acetaminophen (TYLENOL) 650 MG CR tablet, Take 1,300 mg by mouth every 8 (eight) hours as needed. , Disp: , Rfl:  ?  Apple Cider Vinegar 600 MG CAPS, Take 600 mg by mouth 3 (three) times  daily. , Disp: , Rfl:  ?  aspirin EC 81 MG tablet, Take 81 mg by mouth daily. , Disp: , Rfl:  ?  Calcium 600-200 MG-UNIT tablet, Take 1 tablet by mouth 2 (two) times daily with a meal., Disp: , Rfl:  ?  clonazePAM (KLONOPIN) 1 MG tablet, Take 1 mg by mouth at bedtime.  (Patient not taking: Reported on 10/10/2021), Disp: , Rfl:  ?  donepezil (ARICEPT) 5 MG tablet, Take 5 mg by mouth at bedtime., Disp: , Rfl:  ?  furosemide (LASIX) 20 MG tablet, Take 20 mg by mouth daily., Disp: , Rfl:  ?  gabapentin (NEURONTIN) 100 MG capsule, Take 100 mg by mouth 2 (two) times daily., Disp: , Rfl:  ?  ipratropium (ATROVENT) 0.06 % nasal spray, Place 1 spray into both nostrils 2 (two) times daily., Disp: , Rfl:  ?  Leuprolide Acetate, 6 Month, (LUPRON) 45 MG injection, Inject 45 mg into the muscle every 6 (six) months. (Patient not taking: Reported on 10/10/2021), Disp: , Rfl:  ?  LORazepam (ATIVAN) 1 MG tablet, Take 1 tablet (1 mg total) by mouth at bedtime., Disp: 30 tablet, Rfl: 0 ?  memantine (NAMENDA) 10 MG tablet, Take 10 mg by mouth 2 (two) times daily., Disp: , Rfl:  ?  polyethylene glycol powder (GLYCOLAX/MIRALAX) powder, Take 17 g by mouth daily. , Disp: , Rfl:  ?  pravastatin (PRAVACHOL) 20 MG tablet, Take 20 mg by mouth every evening. , Disp: , Rfl:  ?  vitamin B-12 (CYANOCOBALAMIN) 1000 MCG tablet, Take 1,000 mcg by mouth daily., Disp: , Rfl:  ? ?Vitals  ? ?Vitals:  ? 10/16/21 1412 10/16/21 1415 10/16/21 1421 10/16/21 1500  ?BP:  (!) 156/104  (!) 153/92  ?Pulse:  71  72  ?Resp:  (!) 24  (!) 27  ?Temp:  99 ?F (37.2 ?C)    ?TempSrc:  Oral    ?SpO2:  (!) 88% 96% 98%  ?Weight: 75 kg     ?Height: '5\' 9"'$  (1.753 m)     ?  ? ?Body mass index is 24.42 kg/m?. ? ?Physical Exam  ? ?Physical Exam ?Gen: oriented to self, hospital, year. Follows simple commands but inconsistently. ?HEENT: Atraumatic, normocephalic;mucous membranes moist; oropharynx clear, tongue without atrophy or fasciculations. ?Neck: Supple, trachea midline. ?Resp:  CTAB, no w/r/r ?CV: RRR, no m/g/r; nml S1 and S2. 2+ symmetric peripheral pulses. ?Abd: soft/NT/ND; nabs x 4 quad ?Extrem: Nml bulk; no cyanosis, clubbing, or edema. ? ?Neuro: ?*MS: oriented to self, hospital, year. Follows simple commands but inconsistently. ?*Speech: fluid, mild dysarthria, mildly impaired naming, able to repeat ?*  CN:  ?  I: Deferred ?  II,III: PERRLA, VFF by confrontation, optic discs unable to be visualized 2/2 pupillary constriction ?  III,IV,VI: EOMI w/o nystagmus, no ptosis ?  V: Sensation intact from V1 to V3 to LT ?  VII: Eyelid closure was full.  R UMN facial droop. ?  VIII: Hearing intact to voice ?  IX,X: Voice normal, palate elevates symmetrically  ?  XI: SCM/trap 5/5 bilat   ?XII: Tongue protrudes midline, no atrophy or fasciculations  ?*Motor:   Normal bulk.  No tremor, rigidity or bradykinesia. BUE full strength without drift, BLE 1 point each drift but not to bed. ?*Sensory: SILT. Symmetric. Propioception intact bilat.  No double-simultaneous extinction.  ?*Coordination:  UTA 2/2 inability to follow commands ?*Reflexes:  2+ and symmetric throughout without clonus; toes equiv bilat ?*Gait: deferred ? ?NIHSS = 3 for facial droop, motor BLE ? ?Premorbid mRS = 4 ? ? ?Labs  ? ?CBC:  ?Recent Labs  ?Lab 10/10/21 ?1213 10/16/21 ?1417  ?WBC 10.8* 12.5*  ?HGB 15.5 15.0  ?HCT 51.2 49.7  ?MCV 83.1 84.0  ?PLT 147* 176  ? ? ?Basic Metabolic Panel:  ?Lab Results  ?Component Value Date  ? NA 139 10/16/2021  ? K 3.6 10/16/2021  ? CO2 36 (H) 10/16/2021  ? GLUCOSE 108 (H) 10/16/2021  ? BUN 16 10/16/2021  ? CREATININE 1.00 10/16/2021  ? CALCIUM 8.8 (L) 10/16/2021  ? GFRNONAA >60 10/16/2021  ? GFRAA 57 (L) 11/30/2018  ? ?Lipid Panel: No results found for: [place] ?HgbA1c: No results found for: HGBA1C ?Urine Drug Screen: No results found for: LABOPIA, COCAINSCRNUR, [place], [place], THCU, LABBARB  ?Alcohol Level No results found for: ETH ? ? ?Impression  ? ?This is a 88 year old gentleman with past  medical history significant for Alzheimer's disease advanced, B12 deficiency, CAD, hyperlipidemia, hypertension, prostate cancer who is brought in by EMS after a fall with acute onset of right facial droop.  P

## 2021-10-16 NOTE — Assessment & Plan Note (Addendum)
--  possible related to stroke or general decline ?--PT/OT and SNF rehab ? ? ?

## 2021-10-16 NOTE — Assessment & Plan Note (Addendum)
Initially allowed for some permissive hypertension secondary to presumed CVA.  Blood pressures have now been stable. ?Prn hydralazine to SBP 220 and above.  ? ? ?

## 2021-10-16 NOTE — ED Notes (Signed)
Pt sleeping. Titrated oxygen up to 6L, still reading 88%. Will speak with EDP re: other oxygen delivery devices. Pt hard to rouse. ?

## 2021-10-16 NOTE — ED Notes (Signed)
CODE STROKE called to [facility] spoke with [place] ?

## 2021-10-16 NOTE — Code Documentation (Signed)
Chaplain visited with patient's daughter Jackelyn Poling, while pt. received CT scan. Jackelyn Poling was receptive, no further needs at this time.  ?

## 2021-10-16 NOTE — ED Notes (Signed)
Back from CT. Neurologist evaluatd pt in CT and noted R facial droop as only focal deficit. ?

## 2021-10-16 NOTE — Assessment & Plan Note (Addendum)
Patient has advanced dementia, most recently with behavioral disturbance starting about one week ago for which risperdal was started by outpatient neurologist for hallucinations. ?--Risperdal substituted with seroquel by neuro, however, pt became very somnolent, so seroquel d/c'ed ?Plan: ?--hold home risperdal ?--cont home donepezil ?

## 2021-10-16 NOTE — Assessment & Plan Note (Addendum)
#   Acute ischemic stroke ?--dx by Neuro based on exam.  MRI brain was not obtained due to PM. ?--CTA head/neck showed critical R ICA stenosis, which was known to daughter. ?--pt was already taking both ASA and plavix PTA. ?Plan: ?--cont statin ?--cont ASA and plavix ?--monitor on tele for Afib ?- Amb referral to neurology upon discharge ?

## 2021-10-16 NOTE — ED Notes (Signed)
mNIHss was not able to be performed per orders because this nurse was with other critical [place] stroke pt. Pt now sleeping.  ?

## 2021-10-16 NOTE — Assessment & Plan Note (Signed)
Cont DAPT. ?Cont statin therapy.  ? ?

## 2021-10-16 NOTE — ED Provider Notes (Signed)
? ?[facility] ?Provider Note ? ? ? Event Date/Time  ? First MD Initiated Contact with Patient 10/16/21 1419   ?  (approximate) ? ? ?History  ? ?Fall, Altered Mental Status, and R facial droop ? ? ?HPI ? ?Harry Vargas is a 88 y.o. male with a history of CAD, hypertension, dementia who presents after a fall with possible strokelike symptoms reported by EMS.  Patient had a fall last week, daughter reports he has not been "quite right since then ".  Today she noticed that he fell on the camera and rushed over to see him.  Observed him and he seemed okay except was complaining of pain in his back.  Did call EMS, EMS noted right facial droop at approximately 1230 ?  ? ? ?Physical Exam  ? ?Triage Vital Signs: ?ED Triage Vitals  ?Enc Vitals Group  ?   BP 10/16/21 1415 (!) 156/104  ?   Pulse Rate 10/16/21 1415 71  ?   Resp 10/16/21 1415 (!) 24  ?   Temp 10/16/21 1415 99 ?F (37.2 ?C)  ?   Temp Source 10/16/21 1415 Oral  ?   SpO2 10/16/21 1415 (!) 88 %  ?   Weight 10/16/21 1412 75 kg (165 lb 5.5 oz)  ?   Height 10/16/21 1412 1.753 m ('5\' 9"'$ )  ?   Head Circumference --   ?   Peak Flow --   ?   Pain Score --   ?   Pain Loc --   ?   Pain Edu? --   ?   Excl. in [place]? --   ? ? ?Most recent vital signs: ?Vitals:  ? 10/16/21 1421 10/16/21 1500  ?BP:  (!) 153/92  ?Pulse:  72  ?Resp:  (!) 27  ?Temp:    ?SpO2: 96% 98%  ? ? ? ?General: Awake, no distress.  ?CV:  Good peripheral perfusion.  Regular rate and rhythm ?Resp:  Normal effort.  CTA bilaterally ?Abd:  No distention.  No tenderness to palpation ?Other:  Moves upper extremities without difficulty, is able to lift his right leg, states his left leg is hurting.  No facial droop noted ? ? ?ED Results / Procedures / Treatments  ? ?Labs ?(all labs ordered are listed, but only abnormal results are displayed) ?Labs Reviewed  ?COMPREHENSIVE METABOLIC PANEL - Abnormal; Notable for the following components:  ?    Result Value  ? Chloride 95 (*)   ? CO2 36 (*)   ?  Glucose, Bld 108 (*)   ? Calcium 8.8 (*)   ? Albumin 3.3 (*)   ? All other components within normal limits  ?CBC - Abnormal; Notable for the following components:  ? WBC 12.5 (*)   ? RBC 5.92 (*)   ? MCH 25.3 (*)   ? All other components within normal limits  ?CBC WITH DIFFERENTIAL/PLATELET - Abnormal; Notable for the following components:  ? WBC 12.9 (*)   ? RBC 5.93 (*)   ? MCH 25.5 (*)   ? Neutro Abs 10.6 (*)   ? Abs Immature Granulocytes 0.09 (*)   ? All other components within normal limits  ?CBG MONITORING, ED - Abnormal; Notable for the following components:  ? Glucose-Capillary 103 (*)   ? All other components within normal limits  ?RESP PANEL BY RT-PCR (FLU A&B, COVID) ARPGX2  ?ETHANOL  ?PROTIME-INR  ?APTT  ?URINE DRUG SCREEN, QUALITATIVE ([facility] ONLY)  ?URINALYSIS, ROUTINE W REFLEX MICROSCOPIC  ? ? ? ?  EKG ? ? ? ? ?RADIOLOGY ?CT head reviewed by me, no acute abnormality ? ? ? ?PROCEDURES: ? ?Critical Care performed:  ? ?CRITICAL CARE ?Performed by: Lavonia Drafts ? ? ?Total critical care time: 30  minutes ? ?Critical care time was exclusive of separately billable procedures and treating other patients. ? ?Critical care was necessary to treat or prevent imminent or life-threatening deterioration. ? ?Critical care was time spent personally by me on the following activities: development of treatment plan with patient and/or surrogate as well as nursing, discussions with consultants, evaluation of patient's response to treatment, examination of patient, obtaining history from patient or surrogate, ordering and performing treatments and interventions, ordering and review of laboratory studies, ordering and review of radiographic studies, pulse oximetry and re-evaluation of patient's condition. ? ? ?Procedures ? ? ?MEDICATIONS ORDERED IN ED: ?Medications - No data to display ? ? ?IMPRESSION / MDM / ASSESSMENT AND PLAN / ED COURSE  ?I reviewed the triage vital signs and the nursing notes. ? ?Patient presents after a  fall with reported facial droop.  Daughter does have a picture of it, question TIA versus CVA, given that he is within the window will call code stroke although stroke seems unlikely ? ?Patient does seem to be tender in his lumbar vertebrae, suspicious for compression fracture given the way he fell, will send for x-rays ? ?Patient seen by neurologist Dr. Quinn Axe recommends admission for weakness, ams, no mri because pacemaker ? ?Have asked my colleague to follow up on xray results/labs/admission ? ? ? ?  ? ? ?FINAL CLINICAL IMPRESSION(S) / ED DIAGNOSES  ? ?Final diagnoses:  ?Altered mental status, unspecified altered mental status type  ?Fall, initial encounter  ?Cerebrovascular accident (CVA), unspecified mechanism ([place])  ? ? ? ?Rx / DC Orders  ? ?ED Discharge Orders   ? ? None  ? ?  ? ? ? ?Note:  This document was prepared using Dragon voice recognition software and may include unintentional dictation errors. ?  ?Lavonia Drafts, MD ?10/16/21 1512 ? ?

## 2021-10-16 NOTE — H&P (Signed)
?History and Physical  ? ? ?Patient: Harry Vargas WCB:[id number] DOB: Jun 02, 1929 ?DOA: 10/16/2021 ?DOS: the patient was seen and examined on 10/16/2021 ?PCP: Rusty Aus, MD  ?Patient coming from: Home ? ?Chief Complaint:  ?Chief Complaint  ?Patient presents with  ? Fall  ? Altered Mental Status  ? R facial droop  ? ?HPI: Harry Vargas is a 88 y.o. male with medical history significant of Htn, CAD/ pacemaker,  presenting with fall and right sided facial droop. On my eval facial droop is absent. Pt is smiling and is symmetrical. He is cooperative and daughter at bedside says she went to her home for few hours and he fell then. Usually he is able to ambulate and moves around and stays up all night.  ? ? ? ? ? ?Review of Systems: Review of Systems  ?Unable to perform ROS: Dementia  ? ? ?Past Medical History:  ?Diagnosis Date  ? Alzheimer's disease ([place])   ? Arthritis   ? Asthma   ? B12 deficiency   ? Bleeding disorder ([place])   ? Coronary artery disease   ? Dysrhythmia   ? atrial flutter  ? GERD (gastroesophageal reflux disease)   ? Hyperlipidemia   ? Hypertension   ? NSVT (nonsustained ventricular tachycardia)   ? Prostate cancer ([place]) 1990  ? Takes Lupron shots  ? Sinus node dysfunction ([facility])   ? ?Past Surgical History:  ?Procedure Laterality Date  ? BYPASS GRAFT    ? CHOLECYSTECTOMY    ? CIRCUMCISION    ? COLONOSCOPY WITH PROPOFOL N/A 09/19/2019  ? Procedure: COLONOSCOPY WITH PROPOFOL;  Surgeon: Toledo, [name], MD;  Location: [facility] ENDOSCOPY;  Service: Gastroenterology;  Laterality: N/A;  COVID - 54 done at [facility], report on chart  ? CORONARY ARTERY BYPASS GRAFT    ? ESOPHAGOGASTRODUODENOSCOPY (EGD) WITH PROPOFOL N/A 09/19/2019  ? Procedure: ESOPHAGOGASTRODUODENOSCOPY (EGD) WITH PROPOFOL;  Surgeon: Toledo, [name], MD;  Location: [facility] ENDOSCOPY;  Service: Gastroenterology;  Laterality: N/A;  ? INSERT / REPLACE / REMOVE PACEMAKER    ? ?Social History:  reports that he has never smoked. He has never used smokeless  tobacco. He reports that he does not drink alcohol and does not use drugs. ? ?No Known Allergies ? ?Family History  ?Problem Relation Age of Onset  ? Prostate cancer Neg Hx   ? Bladder Cancer Neg Hx   ? Kidney cancer Neg Hx   ? ? ?Prior to Admission medications   ?Medication Sig Start Date End Date Taking? Authorizing Provider  ?Apple Cider Vinegar 600 MG CAPS Take 600 mg by mouth 3 (three) times daily.    Yes [provider]  ?aspirin EC 81 MG tablet Take 81 mg by mouth daily.    Yes [provider]  ?Calcium 600-200 MG-UNIT tablet Take 1 tablet by mouth 2 (two) times daily with a meal.   Yes [provider]  ?donepezil (ARICEPT) 5 MG tablet Take 5 mg by mouth at bedtime.   Yes [provider]  ?furosemide (LASIX) 20 MG tablet Take 20 mg by mouth daily. 07/18/21  Yes [provider]  ?gabapentin (NEURONTIN) 100 MG capsule Take 100 mg by mouth 2 (two) times daily. 09/12/21  Yes [provider]  ?ipratropium (ATROVENT) 0.06 % nasal spray Place 1 spray into both nostrils 2 (two) times daily. 09/14/21  Yes [provider]  ?Leuprolide Acetate, 6 Month, (LUPRON) 45 MG injection Inject 45 mg into the muscle every 6 (six) months.  Yes [provider]  ?memantine (NAMENDA) 10 MG tablet Take 10 mg by mouth 2 (two) times daily. 09/13/21  Yes [provider]  ?pravastatin (PRAVACHOL) 20 MG tablet Take 20 mg by mouth every evening.    Yes [provider]  ?risperiDONE (RISPERDAL) 0.25 MG tablet Take 0.125 mg by mouth at bedtime. 10/11/21  Yes [provider]  ?vitamin B-12 (CYANOCOBALAMIN) 1000 MCG tablet Take 1,000 mcg by mouth daily.   Yes [provider]  ?acetaminophen (TYLENOL) 650 MG CR tablet Take 1,300 mg by mouth every 8 (eight) hours as needed.     [provider]  ?clonazePAM (KLONOPIN) 1 MG tablet Take 1 mg by mouth at bedtime.  ?Patient not taking: Reported on 10/10/2021    [provider]   ?LORazepam (ATIVAN) 1 MG tablet Take 1 tablet (1 mg total) by mouth at bedtime. 10/10/21 11/09/21  Lavonia Drafts, MD  ?polyethylene glycol powder (GLYCOLAX/MIRALAX) powder Take 17 g by mouth daily.     [provider]  ? ? ?Physical Exam: ?Vitals:  ? 10/16/21 1745 10/16/21 1800 10/16/21 1815 10/16/21 1900  ?BP:  (!) 149/85  (!) 158/69  ?Pulse: 73 71 69 74  ?Resp: (!) 24 20 (!) 24 (!) 24  ?Temp:      ?TempSrc:      ?SpO2: 100% (!) 88% 100% 100%  ?Weight:      ?Height:      ? ?Physical Exam ?Vitals and nursing note reviewed.  ?Constitutional:   ?   General: He is not in acute distress. ?   Appearance: Normal appearance. He is not ill-appearing, toxic-appearing or diaphoretic.  ?HENT:  ?   Head: Normocephalic and atraumatic.  ?   Right Ear: Hearing and external ear normal.  ?   Left Ear: Hearing and external ear normal.  ?   Nose: Nose normal. No nasal deformity.  ?   Mouth/Throat:  ?   Lips: Pink.  ?   Mouth: Mucous membranes are moist.  ?   Tongue: No lesions.  ?   Pharynx: Oropharynx is clear.  ?Eyes:  ?   Extraocular Movements: Extraocular movements intact.  ?   Pupils: Pupils are equal, round, and reactive to light.  ?Cardiovascular:  ?   Rate and Rhythm: Normal rate and regular rhythm.  ?   Pulses: Normal pulses.  ?   Heart sounds: Normal heart sounds.  ?Pulmonary:  ?   Effort: Pulmonary effort is normal.  ?   Breath sounds: Normal breath sounds.  ?Abdominal:  ?   General: Bowel sounds are normal. There is no distension.  ?   Palpations: Abdomen is soft. There is no mass.  ?   Tenderness: There is no abdominal tenderness. There is no guarding.  ?   Hernia: No hernia is present.  ?Musculoskeletal:  ?   Right lower leg: No edema.  ?   Left lower leg: No edema.  ?Skin: ?   General: Skin is warm.  ?Neurological:  ?   General: No focal deficit present.  ?   Mental Status: He is alert. He is disoriented.  ?   Cranial Nerves: Cranial nerves 2-12 are intact. No cranial nerve deficit.  ?   Motor: Motor function  is intact. No weakness.  ?   Coordination: Coordination normal.  ?   Deep Tendon Reflexes: Reflexes normal.  ?Psychiatric:     ?   Attention and Perception: Attention normal.     ?   Mood  and Affect: Mood normal.     ?   Speech: Speech normal.     ?   Behavior: Behavior normal. Behavior is cooperative.     ?   Cognition and Memory: Cognition normal.  ? ? ?Data Reviewed: ?Results for orders placed or performed during the hospital encounter of 10/16/21 (from the past 24 hour(s))  ?Comprehensive metabolic panel     Status: Abnormal  ? Collection Time: 10/16/21  2:17 PM  ?Result Value Ref Range  ? Sodium 139 135 - 145 mmol/L  ? Potassium 3.6 3.5 - 5.1 mmol/L  ? Chloride 95 (L) 98 - 111 mmol/L  ? CO2 36 (H) 22 - 32 mmol/L  ? Glucose, Bld 108 (H) 70 - 99 mg/dL  ? BUN 16 8 - 23 mg/dL  ? Creatinine, Ser 1.00 0.61 - 1.24 mg/dL  ? Calcium 8.8 (L) 8.9 - 10.3 mg/dL  ? Total Protein 7.3 6.5 - 8.1 g/dL  ? Albumin 3.3 (L) 3.5 - 5.0 g/dL  ? AST 25 15 - 41 U/L  ? ALT 15 0 - 44 U/L  ? Alkaline Phosphatase 76 38 - 126 U/L  ? Total Bilirubin 0.9 0.3 - 1.2 mg/dL  ? GFR, Estimated >60 >60 mL/min  ? Anion gap 8 5 - 15  ?CBC     Status: Abnormal  ? Collection Time: 10/16/21  2:17 PM  ?Result Value Ref Range  ? WBC 12.5 (H) 4.0 - 10.5 K/uL  ? RBC 5.92 (H) 4.22 - 5.81 MIL/uL  ? Hemoglobin 15.0 13.0 - 17.0 g/dL  ? HCT 49.7 39.0 - 52.0 %  ? MCV 84.0 80.0 - 100.0 fL  ? MCH 25.3 (L) 26.0 - 34.0 pg  ? MCHC 30.2 30.0 - 36.0 g/dL  ? RDW 15.4 11.5 - 15.5 %  ? Platelets 176 150 - 400 K/uL  ? nRBC 0.0 0.0 - 0.2 %  ?CBC with Differential/Platelet     Status: Abnormal  ? Collection Time: 10/16/21  2:17 PM  ?Result Value Ref Range  ? WBC 12.9 (H) 4.0 - 10.5 K/uL  ? RBC 5.93 (H) 4.22 - 5.81 MIL/uL  ? Hemoglobin 15.1 13.0 - 17.0 g/dL  ? HCT 49.9 39.0 - 52.0 %  ? MCV 84.1 80.0 - 100.0 fL  ? MCH 25.5 (L) 26.0 - 34.0 pg  ? MCHC 30.3 30.0 - 36.0 g/dL  ? RDW 15.5 11.5 - 15.5 %  ? Platelets 183 150 - 400 K/uL  ? nRBC 0.0 0.0 - 0.2 %  ? Neutrophils Relative % 83  %  ? Neutro Abs 10.6 (H) 1.7 - 7.7 K/uL  ? Lymphocytes Relative 8 %  ? Lymphs Abs 1.1 0.7 - 4.0 K/uL  ? Monocytes Relative 6 %  ? Monocytes Absolute 0.8 0.1 - 1.0 K/uL  ? Eosinophils Relative 2 %  ? [place]

## 2021-10-16 NOTE — ED Notes (Signed)
Bringing pt to CT. ?

## 2021-10-16 NOTE — Progress Notes (Signed)
Patient has critical R ICA stenosis with angiographic string sign. D/w daughter Nadara Eaton at bedside, they are aware of this and were seen by vascular surgery for same earlier this month. I explained to her that he is at high risk of stroke if he were to have a carotid occlusion, and she expressed understanding. She stated consideration of intervention would not be c/w his wishes because he is DNAR with poor functional status and would not want to undergo invasive procedures. ? ?Su Monks, MD ?[facility] ?[phone] ? ?If 7pm- 7am, please page neurology on call as listed in [place]. ? ?

## 2021-10-16 NOTE — ED Triage Notes (Signed)
From home, [facility] ?2 falls this morning, 1 unwitnessed ?Daughter told EMS R facial droop and was not "acting right". EMS noted R facial droop and drooling upon arrival and this has somewhat resolved since ride to hospital ?Equal grip strength ?Hx dementia ?Pt was started on risperidone this past week for hallucinations ? ?Hx CHF, quadruple bypass ? ?LKW was about 0900-1000 this morning, unknown ? ?EMS VS normal ?12 lead shows paced rhythm ?CBG 138 ?BP 156/86, 89% on RA, 98% on 3L  ? ?18g L wrist ? ?No facial droop noted at this time ?

## 2021-10-16 NOTE — ED Notes (Signed)
Daughter now stating that facial droop was noted around 12pm or 1230pm.  ?

## 2021-10-16 NOTE — ED Notes (Signed)
Will catch up on this pt as soon as can. This nurse received another code stroke pt who is critical. ?

## 2021-10-16 NOTE — ED Notes (Signed)
Provider at bedside

## 2021-10-16 NOTE — Assessment & Plan Note (Signed)
Most likely TIA. ?We are not able to do MRI due to PM.  ?T/T is also limited due to age and pt being at high risk for any aggressive intervention.  ?Per neurolopgy recommendation we will admit to med tele. ?consider placement pt was living on his own with daughter caring for him but may need 24/7 care and placement.  ? ?

## 2021-10-16 NOTE — ED Notes (Signed)
Daughter at bedside, she states pt has "not been himself" since starting risperidone last week. Pt has dementia and has been having recent falls and hallucinations.  ?

## 2021-10-16 NOTE — Assessment & Plan Note (Signed)
Pt in sinus rhythm and rate controlled. ?

## 2021-10-16 NOTE — ED Notes (Signed)
Retail banker to activate code stroke per Dr Corky Downs. ?

## 2021-10-17 ENCOUNTER — Other Ambulatory Visit: Payer: Self-pay

## 2021-10-17 ENCOUNTER — Observation Stay: Payer: Medicare Other

## 2021-10-17 DIAGNOSIS — F02818 Dementia in other diseases classified elsewhere, unspecified severity, with other behavioral disturbance: Secondary | ICD-10-CM

## 2021-10-17 DIAGNOSIS — I639 Cerebral infarction, unspecified: Secondary | ICD-10-CM | POA: Diagnosis not present

## 2021-10-17 DIAGNOSIS — R400 Somnolence: Secondary | ICD-10-CM | POA: Diagnosis present

## 2021-10-17 DIAGNOSIS — I6529 Occlusion and stenosis of unspecified carotid artery: Secondary | ICD-10-CM

## 2021-10-17 DIAGNOSIS — G309 Alzheimer's disease, unspecified: Secondary | ICD-10-CM

## 2021-10-17 DIAGNOSIS — R29810 Facial weakness: Secondary | ICD-10-CM | POA: Diagnosis not present

## 2021-10-17 LAB — COMPREHENSIVE METABOLIC PANEL
ALT: 12 U/L (ref 0–44)
AST: 19 U/L (ref 15–41)
Albumin: 3 g/dL — ABNORMAL LOW (ref 3.5–5.0)
Alkaline Phosphatase: 73 U/L (ref 38–126)
Anion gap: 7 (ref 5–15)
BUN: 16 mg/dL (ref 8–23)
CO2: 34 mmol/L — ABNORMAL HIGH (ref 22–32)
Calcium: 8.6 mg/dL — ABNORMAL LOW (ref 8.9–10.3)
Chloride: 98 mmol/L (ref 98–111)
Creatinine, Ser: 1 mg/dL (ref 0.61–1.24)
GFR, Estimated: >60 mL/min (ref >60)
Glucose, Bld: 95 mg/dL (ref 70–99)
Potassium: 3.7 mmol/L (ref 3.5–5.1)
Sodium: 139 mmol/L (ref 135–145)
Total Bilirubin: 1.1 mg/dL (ref 0.3–1.2)
Total Protein: 6.4 g/dL — ABNORMAL LOW (ref 6.5–8.1)

## 2021-10-17 LAB — CBC
HCT: 48.3 % (ref 39.0–52.0)
Hemoglobin: 14.3 g/dL (ref 13.0–17.0)
MCH: 25.2 pg — ABNORMAL LOW (ref 26.0–34.0)
MCHC: 29.6 g/dL — ABNORMAL LOW (ref 30.0–36.0)
MCV: 85 fL (ref 80.0–100.0)
Platelets: 156 10*3/uL (ref 150–400)
RBC: 5.68 MIL/uL (ref 4.22–5.81)
RDW: 15.4 % (ref 11.5–15.5)
WBC: 12 10*3/uL — ABNORMAL HIGH (ref 4.0–10.5)
nRBC: 0 % (ref 0.0–0.2)

## 2021-10-17 LAB — URINE DRUG SCREEN, QUALITATIVE (ARMC ONLY)
Amphetamines, Ur Screen: NOT DETECTED
Barbiturates, Ur Screen: NOT DETECTED
Benzodiazepine, Ur Scrn: NOT DETECTED
Cannabinoid 50 Ng, Ur ~~LOC~~: NOT DETECTED
Cocaine Metabolite,Ur ~~LOC~~: NOT DETECTED
MDMA (Ecstasy)Ur Screen: NOT DETECTED
Methadone Scn, Ur: NOT DETECTED
Opiate, Ur Screen: NOT DETECTED
Phencyclidine (PCP) Ur S: NOT DETECTED
Tricyclic, Ur Screen: NOT DETECTED

## 2021-10-17 LAB — VITAMIN B12: Vitamin B-12: 667 pg/mL (ref 180–914)

## 2021-10-17 MED ORDER — ENOXAPARIN SODIUM 40 MG/0.4ML IJ SOSY
40.0000 mg | PREFILLED_SYRINGE | INTRAMUSCULAR | Status: DC
  Administered 2021-10-17 – 2021-10-20 (×4): 40 mg via SUBCUTANEOUS
  Filled 2021-10-17 (×4): qty 0.4

## 2021-10-17 NOTE — Evaluation (Signed)
Physical Therapy Evaluation ?Patient Details ?Name: Harry Vargas ?MRN: [id number] ?DOB: Jun 24, 1929 ?Today's Date: 10/17/2021 ? ?History of Present Illness ? Pt is a 88 y/o M admitted on 10/16/21 after presenting with c/o fall & R sided facial droop. Pt noted to have critical R ICA stenosis. PMH: HTN, CAD, pacemaker, arthritis, Alzheimer's dementia, GERD, HLD, HTN, NSVT, prostate CA, sinus node dysfunction  ?Clinical Impression ? Pt seen for PT evaluation with co-tx with OT. Pt's daughter [name] present for session. Prior to admission, pt was living alone in an apartment, ambulating with a rollator but hasn't been doing well since a week prior to admission & daughter has been providing 24 hr assistance. On this date, pt requires total assist +2 for supine<>sit and demonstrates increased lethargy, only opening eyes briefly x 2 times during session. Pt appears to have [facility] strength & ROM but is limited by lethargy & impaired cognition. Debbie reports pt is used to being awake at night so encouraged her to keep lights on in his room during the day. Educated Debbie on recommendations of STR upon d/c & she's agreeable. Will continue to follow pt acutely to progress mobility as able.   ? ?Of note, pt received on 3L/min via nasal cannula with SpO2 >90% so pt placed on room air. ?While sitting EOB pt with inconsistent, poor pleth waveform but lowest reading of 72% SpO2 so pt placed back on 3L/min & SpO2 >90% upon PT departure. Pt does appear to be breathing through mouth vs nose.  ?   ? ?Recommendations for follow up therapy are one component of a multi-disciplinary discharge planning process, led by the attending physician.  Recommendations may be updated based on patient status, additional functional criteria and insurance authorization. ? ?Follow Up Recommendations Skilled nursing-short term rehab (<3 hours/day) ? ?  ?Assistance Recommended at Discharge Frequent or constant Supervision/Assistance  ?Patient can return home  with the following ? Two people to help with walking and/or transfers;Two people to help with bathing/dressing/bathroom;Direct supervision/assist for medications management;Assistance with feeding;Assist for transportation;Help with stairs or ramp for entrance;Assistance with cooking/housework;Direct supervision/assist for financial management ? ?  ?Equipment Recommendations None recommended by PT  ?Recommendations for Other Services ?    ?  ?Functional Status Assessment Patient has had a recent decline in their functional status and demonstrates the ability to make significant improvements in function in a reasonable and predictable amount of time.  ? ?  ?Precautions / Restrictions Precautions ?Precautions: Fall ?Restrictions ?Weight Bearing Restrictions: No  ? ?  ? ?Mobility ? Bed Mobility ?Overal bed mobility: Needs Assistance ?Bed Mobility: Supine to Sit, Sit to Supine ?  ?  ?Supine to sit: Total assist, +2 for physical assistance ?Sit to supine: Total assist, +2 for physical assistance ?  ?  ?  ? ?Transfers ?  ?  ?  ?  ?  ?  ?  ?  ?  ?  ?  ? ?Ambulation/Gait ?  ?  ?  ?  ?  ?  ?  ?  ? ?Stairs ?  ?  ?  ?  ?  ? ?Wheelchair Mobility ?  ? ?Modified Rankin (Stroke Patients Only) ?  ? ?  ? ?Balance Overall balance assessment: Needs assistance ?Sitting-balance support: Feet supported, Bilateral upper extremity supported ?Sitting balance-Leahy Scale: Poor ?  ?  ?  ?  ?  ?  ?  ?  ?  ?  ?  ?  ?  ?  ?  ?  ?   ? ? ? ?  Pertinent Vitals/Pain Pain Assessment ?Pain Assessment: Faces ?Faces Pain Scale: Hurts a little bit ?Pain Location: generalized, grimacing with movement. ?Pain Descriptors / Indicators: Grimacing ?Pain Intervention(s): Monitored during session, Repositioned  ? ? ?Home Living Family/patient expects to be discharged to:: Private residence ?Living Arrangements: Alone ?Available Help at Discharge: Family;Available PRN/intermittently ?Type of Home: Apartment ?Home Access: Level entry ?  ?  ?  ?Home Layout: One  level ?  ?Additional Comments: Pt was living alone in a single level apartment with level entry until last week when daughter has had to stay with him 24/7.  ?  ?Prior Function   ?  ?  ?  ?  ?  ?  ?  ?  ?  ? ? ?Hand Dominance  ? Dominant Hand: Right ? ?  ?Extremity/Trunk Assessment  ? Upper Extremity Assessment ?Upper Extremity Assessment: Generalized weakness ?  ? ?Lower Extremity Assessment ?Lower Extremity Assessment: Generalized weakness;Difficult to assess due to impaired cognition (PT performs PROM to ankles, hips & knees & PROM seems to be WNL) ?  ? ?Cervical / Trunk Assessment ?Cervical / Trunk Assessment: Kyphotic (forward head)  ?Communication  ? Communication: Expressive difficulties  ?Cognition Arousal/Alertness: Lethargic ?  ?Overall Cognitive Status: History of cognitive impairments - at baseline ?  ?  ?  ?  ?  ?  ?  ?  ?  ?  ?  ?  ?  ?  ?  ?  ?General Comments: Pt's daughter present & reports at baseline pt is able to recognize her & engage in conversation. On this date, PT/OT place cold, wet cloth on forehead, tactile stimulation & sternal rub with pt opening eyes only ~2 times during session but quickly closing them again. Follows ~2-3 simple commands throughout entire session. ?  ?  ? ?  ?General Comments   ? ?  ?Exercises    ? ?Assessment/Plan  ?  ?PT Assessment Patient needs continued PT services  ?PT Problem List Decreased strength;Decreased coordination;Cardiopulmonary status limiting activity;Decreased range of motion;Decreased cognition;Decreased activity tolerance;Decreased knowledge of use of DME;Decreased balance;Decreased mobility;Decreased knowledge of precautions;Decreased safety awareness ? ?   ?  ?PT Treatment Interventions Therapeutic exercise;Gait training;Balance training;DME instruction;Wheelchair mobility training;Stair training;Neuromuscular re-education;Functional mobility training;Therapeutic activities;Patient/family education;Cognitive remediation   ? ?PT Goals (Current  goals can be found in the Care Plan section)  ?Acute Rehab PT Goals ?Patient Stated Goal: to get better ?PT Goal Formulation: With family ?Time For Goal Achievement: 10/31/21 ?Potential to Achieve Goals: Fair ? ?  ?Frequency Min 2X/week ?  ? ? ?Co-evaluation PT/OT/SLP Co-Evaluation/Treatment: Yes ?Reason for Co-Treatment: Complexity of the patient's impairments (multi-system involvement);Necessary to address cognition/behavior during functional activity;For patient/therapist safety ?PT goals addressed during session: Mobility/safety with mobility;Balance ?  ?  ? ? ?  ?AM-PAC PT "6 Clicks" Mobility  ?Outcome Measure Help needed turning from your back to your side while in a flat bed without using bedrails?: Total ?Help needed moving from lying on your back to sitting on the side of a flat bed without using bedrails?: Total ?Help needed moving to and from a bed to a chair (including a wheelchair)?: Total ?Help needed standing up from a chair using your arms (e.g., wheelchair or bedside chair)?: Total ?Help needed to walk in hospital room?: Total ?Help needed climbing 3-5 steps with a railing? : Total ?6 Click Score: 6 ? ?  ?End of Session Equipment Utilized During Treatment: Oxygen ?Activity Tolerance: Patient limited by lethargy ?Patient left: in bed;with call bell/phone within reach;with  family/visitor present ?Nurse Communication:  (O2) ?PT Visit Diagnosis: Unsteadiness on feet (R26.81);Muscle weakness (generalized) (M62.81);Difficulty in walking, not elsewhere classified (R26.2);Other abnormalities of gait and mobility (R26.89) ?  ? ?Time: 8257-4935 ?PT Time Calculation (min) (ACUTE ONLY): 22 min ? ? ?Charges:   PT Evaluation ?$PT Eval High Complexity: 1 High ?  ?  ?   ? ? ?Lavone Nian, PT, DPT ?10/17/21, 3:20 PM ? ? ?Waunita Schooner ?10/17/2021, 3:18 PM ? ?

## 2021-10-17 NOTE — [place] FL2 (Signed)
?[place] MEDICAID FL2 LEVEL OF CARE SCREENING TOOL  ?  ? ?IDENTIFICATION  ?Patient Name: ?Harry Vargas Birthdate: 30-May-1929 Sex: male Admission Date (Current Location): ?10/16/2021  ?[place] Number: ? [place] ?  Facility and Address:  ?[facility], [address] [place], [place] [address] ?     Provider Number: ?[id number]  ?Attending Physician Name and Address:  ?Enzo Bi, MD ? Relative Name and Phone Number:  ?[name] (Daughter)   [phone] ?   ?Current Level of Care: ?Hospital Recommended Level of Care: ?[place] Prior Approval Number: ?  ? ?Date Approved/Denied: ?  PASRR Number: ?[id number] A ? ?Discharge Plan: ?SNF ?  ? ?Current Diagnoses: ?Patient Active Problem List  ? Diagnosis Date Noted  ? Somnolence 10/17/2021  ? Falls 10/16/2021  ? Facial droop due to acute stroke ([place]) 10/16/2021  ? Alzheimer's dementia with behavioral disturbance ([place]) 09/05/2019  ? Atrial flutter ([place]) 01/10/2018  ? Cardiac pacemaker in situ 10/26/2017  ? Benign essential hypertension 09/27/2016  ? 3-vessel CAD 07/06/2016  ? Cancer of prostate ([place]) 07/06/2016  ? Hyperlipidemia, mixed 07/06/2016  ? ? ?Orientation RESPIRATION BLADDER Height & Weight   ?  ?Self, Time, Situation, Place ? O2 (3L/min) Continent Weight: 165 lb 5.5 oz (75 kg) ?Height:  '5\' 9"'$  (175.3 cm)  ?BEHAVIORAL SYMPTOMS/MOOD NEUROLOGICAL BOWEL NUTRITION STATUS  ?    Continent    ?AMBULATORY STATUS COMMUNICATION OF NEEDS Skin   ?Extensive Assist Verbally Normal ?  ?  ?  ?    ?     ?     ? ? ?Personal Care Assistance Level of Assistance  ?Bathing, Feeding, Dressing Bathing Assistance: Maximum assistance ?Feeding assistance: Limited assistance ?Dressing Assistance: Maximum assistance ?   ? ?Functional Limitations Info  ?    ?  ?   ? ? ?SPECIAL CARE FACTORS FREQUENCY  ?PT (By licensed PT), OT (By licensed OT)   ?  ?PT Frequency: 5 times per week ?OT Frequency: 5 times per week ?  ?  ?  ?    ? ? ?Contractures    ? ? ?Additional Factors Info  ?Code Status Code Status Info: DNR ?  ?  ?  ?  ?   ? ?Current Medications (10/17/2021):  This is the current hospital active medication list ?Current Facility-Administered Medications  ?Medication Dose Route Frequency Provider Last Rate Last Admin  ? acetaminophen (TYLENOL) tablet 650 mg  650 mg Oral Q6H PRN Para Skeans, MD      ? Or  ? acetaminophen (TYLENOL) suppository 650 mg  650 mg Rectal Q6H PRN Para Skeans, MD      ? aspirin chewable tablet 81 mg  81 mg Oral Daily Florina Ou V, MD   81 mg at 10/17/21 1106  ? atorvastatin (LIPITOR) tablet 40 mg  40 mg Oral Daily Para Skeans, MD   40 mg at 10/17/21 1106  ? clopidogrel (PLAVIX) tablet 75 mg  75 mg Oral Daily Para Skeans, MD   75 mg at 10/17/21 1106  ? donepezil (ARICEPT) tablet 5 mg  5 mg Oral QHS Para Skeans, MD   5 mg at 10/16/21 2249  ? enoxaparin (LOVENOX) injection 40 mg  40 mg Subcutaneous Q24H Enzo Bi, MD      ? hydrALAZINE (APRESOLINE) injection 10 mg  10 mg Intravenous Q8H PRN Para Skeans, MD      ? ipratropium (ATROVENT) 0.06 % nasal spray 1 spray  1 spray Each Nare BID Para Skeans, MD      ? memantine [facility]) tablet 10 mg  10 mg Oral BID Para Skeans, MD   10 mg at 10/17/21 1106  ? ?Current Outpatient Medications  ?Medication Sig Dispense Refill  ? Apple Cider Vinegar 600 MG CAPS Take 600 mg by mouth 3 (three) times daily.     ? aspirin EC 81 MG tablet Take 81 mg by mouth daily.     ? Calcium 600-200 MG-UNIT tablet Take 1 tablet by mouth 2 (two) times daily with a meal.    ? donepezil (ARICEPT) 5 MG tablet Take 5 mg by mouth at bedtime.    ? furosemide (LASIX) 20 MG tablet Take 20 mg by mouth daily.    ? gabapentin (NEURONTIN) 100 MG capsule Take 100 mg by mouth 2 (two) times daily.    ? ipratropium (ATROVENT) 0.06 % nasal spray Place 1 spray into both nostrils 2 (two) times daily.    ? Leuprolide Acetate, 6 Month, (LUPRON) 45 MG injection Inject 45 mg into the muscle every 6 (six)  months.    ? memantine (NAMENDA) 10 MG tablet Take 10 mg by mouth 2 (two) times daily.    ? pravastatin (PRAVACHOL) 20 MG tablet Take 20 mg by mouth every evening.     ? risperiDONE (RISPERDAL) 0.25 MG tablet Take 0.125 mg by mouth at bedtime.    ? vitamin B-12 (CYANOCOBALAMIN) 1000 MCG tablet Take 1,000 mcg by mouth daily.    ? acetaminophen (TYLENOL) 650 MG CR tablet Take 1,300 mg by mouth every 8 (eight) hours as needed.     ? clonazePAM (KLONOPIN) 1 MG tablet Take 1 mg by mouth at bedtime.  (Patient not taking: Reported on 10/10/2021)    ? LORazepam (ATIVAN) 1 MG tablet Take 1 tablet (1 mg total) by mouth at bedtime. 30 tablet 0  ? polyethylene glycol powder (GLYCOLAX/MIRALAX) powder Take 17 g by mouth daily.     ? ? ? ?Discharge Medications: ?Please see discharge summary for a list of discharge medications. ? ?Relevant Imaging Results: ? ?Relevant Lab Results: ? ? ?Additional Information ?  ? ?[place], LCSWA ? ? ? ? ?

## 2021-10-17 NOTE — Progress Notes (Signed)
PT Cancellation Note ? ?Patient Details ?Name: Harry Vargas ?MRN: [id number] ?DOB: 02-Sep-1928 ? ? ?Cancelled Treatment:    Reason Eval/Treat Not Completed: Fatigue/lethargy limiting ability to participate. Orders received and chart reviewed. Pt lethargic in bed, does not awake to voice, light touch, sternal rub. PT elevated ED cot maximally where pt aroused more moaning and opening eyes briefly but unable to stay awake. PT receiving PLOF, home lay out, DME, and pt's assist level from daughter at bed side. Will re-attempt at later time/date when pt is more alert. ? ? ?Salem Caster. Fairly IV, PT, DPT ?Physical Therapist- [place]  ?[facility]  ?10/17/2021, 9:51 AM ?

## 2021-10-17 NOTE — Assessment & Plan Note (Addendum)
--  pt was staying up all night PTA, however, has been very somnolent since presentation, possibly due to seroquel (ordered new in place of home resperidone) and gabapentin.  Seroquel and home gabapentin d/c'ed ?Plan: ?--avoid sedating meds ?

## 2021-10-17 NOTE — Progress Notes (Signed)
?  Progress Note ? ? ?Patient: Harry Vargas OIL:[id number] DOB: 11/05/1928 DOA: 10/16/2021     0 ?DOS: the patient was seen and examined on 10/17/2021 ?  ?Brief hospital course: ?Harry Vargas is a 88 y.o. male with medical history significant of HTN, CAD, pacemaker, dementia presenting with fall and right sided facial droop. ? ?Assessment and Plan: ?* Falls ?--possible related to stroke or general decline ?--PT/OT when pt becomes more alert ? ? ? ?Benign essential hypertension ?--hold home lasix ?Prn hydralazine to SBP 220 and above.  ? ? ? ?3-vessel CAD ?Cont DAPT. ?Cont statin therapy.  ? ? ?Alzheimer's dementia with behavioral disturbance (Harry Vargas) ?Patient has advanced dementia, most recently with behavioral disturbance starting about one week ago for which risperdal was started by outpatient neurologist for hallucinations. ?--Risperdal substituted with seroquel by neuro, however, pt became very somnolent. ?Plan: ?--d/c seroquel ?--hold home risperdal ?--cont home donepezil ? ?Atrial flutter ([place]) ?Pt in sinus rhythm and rate controlled. ? ?Somnolence ?--pt was staying up all night PTA, however, has been very somnolent since presentation, possibly due to seroquel (ordered new in place of home resperidone) and gabapentin. ?Plan: ?--d/c seroquel ?--hold home gabapentin ?--NPO for now until pt becomes more awake ? ?Facial droop due to acute stroke [facility]) ?# Acute ischemic stroke ?--dx by Neuro based on exam.  MRI brain was not obtained due to PM. ?--CTA head/neck showed critical R ICA stenosis, which was known to daughter. ?--pt was already taking both ASA and plavix PTA. ?Plan: ?- Permissive HTN x48 hrs from sx onset goal BP <220/110. PRN labetalol or hydralazine if BP above these parameters. Avoid oral antihypertensives. ?- TTE ?- q4 hr neuro checks ?- PT/OT/SLP ?- Amb referral to neurology upon discharge ? ? ? ? ?  ? ?Subjective:  ?Pt was somnolent during rounds.  Per daughter, pt slept all night, which was a change  from PTA when pt stayed up all nights. ? ? ?Physical Exam: ? ?Constitutional: NAD, somnolent ?CV: No cyanosis.   ?RESP: normal respiratory effort, on 3L ?SKIN: warm, dry ? ? ?Data Reviewed: ? ?Family Communication: daughter updated at bedside today ? ?Disposition: ?Status is: Observation ? ? Planned Discharge Destination: Skilled nursing facility ? ? ? ?Time spent: 50 minutes ? ?Author: ?Enzo Bi, MD ?10/17/2021 3:53 PM ? ?For on call review [email or web address].  ?

## 2021-10-17 NOTE — Hospital Course (Addendum)
Harry Vargas is a 88 y.o. male with medical history significant of HTN, CAD, pacemaker, Alzheimer's dementia who presented to the emergency room on 3/18 with fall and right sided facial droop.  Although CT scan noted no evidence of stroke, work-up noted 70% stenosis of the proximal left ICA as well as severe bilateral vertebral artery stenosis.  Patient unable to get MRI due to pacemaker and neurology felt that patient clinically had an acute CVA.  Recommendation was for aspirin and Plavix indefinitely.  Patient seen by PT and OT and plan is for skilled nursing facility.  Patient stable and discharged on 3/23. ?

## 2021-10-17 NOTE — TOC Progression Note (Signed)
Transition of Care (TOC) - Progression Note  ? ? ?Patient Details  ?Name: Harry Vargas ?MRN: [id number] ?Date of Birth: Apr 11, 1929 ? ?Transition of Care (TOC) CM/SW Contact  ?[place], LCSWA ?Phone Number: ?10/17/2021, 4:07 PM ? ?Clinical Narrative:    ? ?CSW spoke to patient's daughter [name] [phone]). Neoma Laming reported they have no specific SNF preferences but would prefer one close to [facility] area. CSW starting bed search. ? ? ?TOC will continue to follow.  ? ? ?  ?  ? ?Expected Discharge Plan and Services ?  ?  ?  ?  ?  ?                ?  ?  ?  ?  ?  ?  ?  ?  ?  ?  ? ? ?Social Determinants of Health (SDOH) Interventions ?  ? ?Readmission Risk Interventions ?No flowsheet data found. ? ?

## 2021-10-17 NOTE — [place] FL2 (Deleted)
?[place] MEDICAID FL2 LEVEL OF CARE SCREENING TOOL  ?  ? ?IDENTIFICATION  ?Patient Name: ?Harry Vargas Birthdate: 06-Jul-1929 Sex: male Admission Date (Current Location): ?10/16/2021  ?[place] and [place] Number: ?   ?  Facility and Address:  ?  ?     Provider Number: ?   ?Attending Physician Name and Address:  ?Enzo Bi, MD ? Relative Name and Phone Number:  ?  ?   ?Current Level of Care: ?  Recommended Level of Care: ?  Prior Approval Number: ?  ? ?Date Approved/Denied: ?  PASRR Number: ?  ? ?Discharge Plan: ?  ?  ? ?Current Diagnoses: ?Patient Active Problem List  ? Diagnosis Date Noted  ? Somnolence 10/17/2021  ? Falls 10/16/2021  ? Facial droop due to acute stroke ([place]) 10/16/2021  ? Alzheimer's dementia with behavioral disturbance ([place]) 09/05/2019  ? Atrial flutter ([place]) 01/10/2018  ? Cardiac pacemaker in situ 10/26/2017  ? Benign essential hypertension 09/27/2016  ? 3-vessel CAD 07/06/2016  ? Cancer of prostate ([place]) 07/06/2016  ? Hyperlipidemia, mixed 07/06/2016  ? ? ?Orientation RESPIRATION BLADDER Height & Weight   ?  ?  ?     Weight: 165 lb 5.5 oz (75 kg) ?Height:  '5\' 9"'$  (175.3 cm)  ?BEHAVIORAL SYMPTOMS/MOOD NEUROLOGICAL BOWEL NUTRITION STATUS  ?         ?AMBULATORY STATUS COMMUNICATION OF NEEDS Skin   ?      ?  ?  ?  ?    ?     ?     ? ? ?Personal Care Assistance Level of Assistance  ?    ?  ?  ?   ? ?Functional Limitations Info  ?    ?  ?   ? ? ?SPECIAL CARE FACTORS FREQUENCY  ?    ?  ?  ?  ?  ?  ?  ?   ? ? ?Contractures    ? ? ?Additional Factors Info  ?    ?  ?  ?  ?  ?   ? ?Current Medications (10/17/2021):  This is the current hospital active medication list ?Current Facility-Administered Medications  ?Medication Dose Route Frequency Provider Last Rate Last Admin  ? acetaminophen (TYLENOL) tablet 650 mg  650 mg Oral Q6H PRN Para Skeans, MD      ? Or  ? acetaminophen (TYLENOL) suppository 650 mg  650 mg Rectal Q6H PRN Para Skeans, MD      ? aspirin chewable tablet 81 mg  81 mg Oral Daily  Florina Ou V, MD   81 mg at 10/17/21 1106  ? atorvastatin (LIPITOR) tablet 40 mg  40 mg Oral Daily Para Skeans, MD   40 mg at 10/17/21 1106  ? clopidogrel (PLAVIX) tablet 75 mg  75 mg Oral Daily Para Skeans, MD   75 mg at 10/17/21 1106  ? donepezil (ARICEPT) tablet 5 mg  5 mg Oral QHS Para Skeans, MD   5 mg at 10/16/21 2249  ? enoxaparin (LOVENOX) injection 40 mg  40 mg Subcutaneous Q24H Enzo Bi, MD      ? hydrALAZINE (APRESOLINE) injection 10 mg  10 mg Intravenous Q8H PRN Para Skeans, MD      ? ipratropium (ATROVENT) 0.06 % nasal spray 1 spray  1 spray Each Nare BID Para Skeans, MD      ? memantine [facility]) tablet 10 mg  10 mg Oral BID Para Skeans, MD  10 mg at 10/17/21 1106  ? ?Current Outpatient Medications  ?Medication Sig Dispense Refill  ? Apple Cider Vinegar 600 MG CAPS Take 600 mg by mouth 3 (three) times daily.     ? aspirin EC 81 MG tablet Take 81 mg by mouth daily.     ? Calcium 600-200 MG-UNIT tablet Take 1 tablet by mouth 2 (two) times daily with a meal.    ? donepezil (ARICEPT) 5 MG tablet Take 5 mg by mouth at bedtime.    ? furosemide (LASIX) 20 MG tablet Take 20 mg by mouth daily.    ? gabapentin (NEURONTIN) 100 MG capsule Take 100 mg by mouth 2 (two) times daily.    ? ipratropium (ATROVENT) 0.06 % nasal spray Place 1 spray into both nostrils 2 (two) times daily.    ? Leuprolide Acetate, 6 Month, (LUPRON) 45 MG injection Inject 45 mg into the muscle every 6 (six) months.    ? memantine (NAMENDA) 10 MG tablet Take 10 mg by mouth 2 (two) times daily.    ? pravastatin (PRAVACHOL) 20 MG tablet Take 20 mg by mouth every evening.     ? risperiDONE (RISPERDAL) 0.25 MG tablet Take 0.125 mg by mouth at bedtime.    ? vitamin B-12 (CYANOCOBALAMIN) 1000 MCG tablet Take 1,000 mcg by mouth daily.    ? acetaminophen (TYLENOL) 650 MG CR tablet Take 1,300 mg by mouth every 8 (eight) hours as needed.     ? clonazePAM (KLONOPIN) 1 MG tablet Take 1 mg by mouth at bedtime.  (Patient not taking: Reported  on 10/10/2021)    ? LORazepam (ATIVAN) 1 MG tablet Take 1 tablet (1 mg total) by mouth at bedtime. 30 tablet 0  ? polyethylene glycol powder (GLYCOLAX/MIRALAX) powder Take 17 g by mouth daily.     ? ? ? ?Discharge Medications: ?Please see discharge summary for a list of discharge medications. ? ?Relevant Imaging Results: ? ?Relevant Lab Results: ? ? ?Additional Information ?  ? ?[place], LCSWA ? ? ? ? ?

## 2021-10-17 NOTE — Progress Notes (Signed)
S: Patient extremely somnolent today, arousable to sternal rub. Repeat head CT wo NAICP (personal review).  ? ?O: ? ?Vitals:  ? 10/17/21 1500 10/17/21 1702  ?BP: 122/65 (!) 145/71  ?Pulse: 69 70  ?Resp: (!) 21 18  ?Temp:  97.9 ?F (36.6 ?C)  ?SpO2: 97% 97%  ? ?Physical Exam ?Gen: arousable to sternal rub, then falls back asleep, follows simple commands briefly before doing so ?HEENT: Atraumatic, normocephalic;mucous membranes moist; oropharynx clear, tongue without atrophy or fasciculations. ?Neck: Supple, trachea midline. ?Resp: CTAB, no w/r/r ?CV: RRR, no m/g/r; nml S1 and S2. 2+ symmetric peripheral pulses. ?Abd: soft/NT/ND; nabs x 4 quad ?Extrem: Nml bulk; no cyanosis, clubbing, or edema. ?  ?Neuro: ?*MS: arousable to sternal rub, then falls back asleep, follows simple commands briefly before doing so ?*Speech: fluid, mild dysarthria, mildly impaired naming, able to repeat ?*CN: PERRL, blinks to threat bilat, EOMI, face symmetric at rest, hearing intact to voice ?*Motor: anti-gravity BUE, wiggles toes BLE, not able to cooperate with formal strength exam 2/2 mental status ?*Sensory: SILT ?*Coordination:  UTA 2/2 inability to follow commands ?*Reflexes:  2+ and symmetric throughout without clonus; toes equiv bilat ?*Gait: deferred ?  ?A/P: This is a 88 year old gentleman with past medical history significant for Alzheimer's disease advanced, B12 deficiency, CAD, hyperlipidemia, hypertension, prostate cancer who is brought in by EMS after a fall with acute onset of right facial droop.  Patient has advanced dementia, most recently with behavioral disturbance starting about one week ago for which risperdal was started by outpatient neurologist for hallucinations.  ? ?I suspect his encephalopathy represents progression of his underlying dementia, and he will require PT/OT evaluation to determine safe dispo. I switched him from risperdal to seroquel '25mg'$  qhs on admission since per daughter's report he had barely slept  in the past week. The seroquel may account for his lethargy today and will be held. Repeat head CT today showing NAICP is reassuring. ? ?Patient has critical R ICA stenosis with angiographic string sign. D/w daughter Nadara Eaton at bedside, they are aware of this and were seen by vascular surgery for same earlier this month. I explained to her that he is at high risk of stroke if he were to have a carotid occlusion, and she expressed understanding. She stated consideration of intervention would not be c/w his wishes because he is DNAR with poor functional status and would not want to undergo invasive procedures. ? ?He is also undergoing stroke workup for the acute onset R facial droop, which has now resolved. ? ?# Acute ischemic stroke ?- Permissive HTN x48 hrs from sx onset goal BP <220/110. PRN labetalol or hydralazine if BP above these parameters. Avoid oral antihypertensives. ?- Unable to obtain MRI 2/2 PM ?- f/u TTE ?- Check A1c and LDL + add statin per guidelines ?- ASA '81mg'$  daily + plavix '75mg'$  daily  - given his critical ICA stenosis I would leave him on DAPT indefinitely ?- q4 hr neuro checks ?- STAT head CT for any change in neuro exam ?- Tele ?- PT/OT/SLP ?- Stroke education ?- Amb referral to neurology upon discharge ?  ?# Dementia with behavioral disturbance ?- D/c seroquel and gabapentin ?- Avoid deliriogenic meds ?- PT/OT eval ?  ?Will continue to follow ? ?Su Monks, MD ?[facility] ?[phone] ? ?If 7pm- 7am, please page neurology on call as listed in [place]. ? ? ? ?

## 2021-10-17 NOTE — Evaluation (Signed)
Occupational Therapy Evaluation ?Patient Details ?Name: Harry Vargas ?MRN: [id number] ?DOB: 09-Sep-1928 ?Today's Date: 10/17/2021 ? ? ?History of Present Illness Pt is a 88 y/o M admitted on 10/16/21 after presenting with c/o fall & R sided facial droop. Pt noted to have critical R ICA stenosis. PMH: HTN, CAD, pacemaker, arthritis, Alzheimer's dementia, GERD, HLD, HTN, NSVT, prostate CA, sinus node dysfunction  ? ?Clinical Impression ?  ?Pt seen for OT evaluation this date. Session coordinated with PT to address pt's cognition during functional activity. On this date, OT/PT placed cold, wet cloth on forehead and provided tactile stimulation & sternal rub, with pt opening eyes only ~2 times during session but quickly closing them again. Pt followed ~2-3 simple commands throughout entire session. Pt's daughter Harry Vargas) present & reports at baseline pt is able to recognize her & engage in conversation. Prior to admission, pt was using a 4WW for functional mobility of household distances and completing ADLs requiring only SUPERVISION (required verbal cues for sequencing). Pt currently requires TOTAL A+2 for bed mobility, MIN A-MIN GUARD for static sitting balance at EOB (able to sit EOB for ~9mns), and MAX A for washing face with washcloth while seated EOB d/t decreased cognition. Pt's daughter educated on importance of providing environmental stimuli (e.g., keeping room lights on) while pt is admitted in hospital to encourage proper sleep routine; pt's daughter verbalized understanding and pt left sitting upright in bed with room lights on. Pt would benefit from additional skilled OT services to maximize return to PLOF and minimize risk of future falls, injury, caregiver burden, and readmission. Upon discharge, recommend SNF. ?   ? ?Recommendations for follow up therapy are one component of a multi-disciplinary discharge planning process, led by the attending physician.  Recommendations may be updated based on patient  status, additional functional criteria and insurance authorization.  ? ?Follow Up Recommendations ? Skilled nursing-short term rehab (<3 hours/day)  ?  ?Assistance Recommended at Discharge Frequent or constant Supervision/Assistance  ?Patient can return home with the following Two people to help with walking and/or transfers;Two people to help with bathing/dressing/bathroom;Assistance with feeding ? ?  ?Functional Status Assessment ? Patient has had a recent decline in their functional status and demonstrates the ability to make significant improvements in function in a reasonable and predictable amount of time.  ?Equipment Recommendations ? Other (comment) (defer to next venue of care)  ?  ?   ?Precautions / Restrictions Precautions ?Precautions: Fall ?Restrictions ?Weight Bearing Restrictions: No  ? ?  ? ?Mobility Bed Mobility ?Overal bed mobility: Needs Assistance ?Bed Mobility: Supine to Sit, Sit to Supine ?  ?  ?Supine to sit: Total assist, +2 for physical assistance ?Sit to supine: Total assist, +2 for physical assistance ?  ?  ?  ? ?Transfers ?  unsafe/unable this date d/t decreased alertness ?  ?  ?  ?  ?  ?  ?  ?  ?  ?  ? ?  ?Balance Overall balance assessment: Needs assistance ?Sitting-balance support: Feet supported, Bilateral upper extremity supported ?Sitting balance-Leahy Scale: Poor ?Sitting balance - Comments: Able to sit EOB for ~10 mins with MIN GUARD-MIN A ?  ?  ?  ?Standing balance comment: unsafe/unable this date d/t decreased alertness ?  ?  ?  ?  ?  ?  ?  ?  ?  ?  ?  ?   ? ?ADL either performed or assessed with clinical judgement  ? ?ADL Overall ADL's : Needs assistance/impaired ?  ?  ?  Grooming: Wash/dry hands;Maximal assistance;Sitting ?  ?  ?  ?  ?  ?  ?  ?  ?  ?  ?  ?  ?  ?  ?  ?  ?   ? ? ? ?Vision Baseline Vision/History: 2 Legally blind (L eye blind) ?Additional Comments: unable to formally assess d/t impaired cognition  ?   ?   ?   ? ?Pertinent Vitals/Pain Pain Assessment ?Pain  Assessment: PAINAD ?Breathing: normal ?Negative Vocalization: occasional moan/groan, low speech, negative/disapproving quality ?Facial Expression: smiling or inexpressive ?Body Language: relaxed ?Consolability: no need to console ?PAINAD Score: 1 ?Pain Intervention(s): Monitored during session, Repositioned  ? ? ? ?Hand Dominance Right ?  ?Extremity/Trunk Assessment Upper Extremity Assessment ?Upper Extremity Assessment: Generalized weakness;Difficult to assess due to impaired cognition ?  ?Lower Extremity Assessment ?Lower Extremity Assessment: Generalized weakness;Difficult to assess due to impaired cognition ?  ?Cervical / Trunk Assessment ?Cervical / Trunk Assessment: Kyphotic (forward head) ?  ?Communication Communication ?Communication: Expressive difficulties ?  ?Cognition Arousal/Alertness: Lethargic ?Behavior During Therapy: Flat affect ?Overall Cognitive Status: History of cognitive impairments - at baseline ?  ?  ?  ?  ?  ?  ?  ?  ?  ?  ?  ?  ?  ?  ?  ?  ?General Comments: Pt's daughter present & reports at baseline pt is able to recognize her & engage in conversation. On this date, OT/PT place cold, wet cloth on forehead, tactile stimulation & sternal rub with pt opening eyes only ~2 times during session but quickly closing them again. Follows ~2-3 simple commands throughout entire session. ?  ?  ?General Comments  While sitting EOB on RA, pt with inconsistent, poor pleth waveform but lowest reading of 72%. Pt placed back on 3L/min & SpO2 >90% at end of session. Pt does appear to be breathing through mouth vs nose. ? ?  ?   ?   ? ? ?Home Living Family/patient expects to be discharged to:: Private residence ?Living Arrangements: Alone ?Available Help at Discharge: Family;Available 24 hours/day;Personal care attendant;Available PRN/intermittently (Daughter has been having difficulty getting PCA to assist regularly) ?Type of Home: Apartment ?Home Access: Level entry ?  ?  ?Home Layout: One level ?  ?   ?Bathroom Shower/Tub: Walk-in shower ?  ?  ?  ?  ?Home Equipment: Hand held shower head;Shower seat;Rollator (4 wheels) ?  ?Additional Comments: Pt was living alone in a single level apartment with level entry until last week when daughter has had to stay with him 24/7. ?  ? ?  ?Prior Functioning/Environment Prior Level of Function : Needs assist ? Cognitive Assist : ADLs (cognitive) ?  ?  ?  ?  ?  ?Mobility Comments: Uses 4ww for functional mobility. Had a fall last week ?ADLs Comments: Pt required verbal cues for sequencing ADLs ?  ? ?  ?  ?OT Problem List: Decreased strength;Decreased activity tolerance;Impaired balance (sitting and/or standing);Decreased cognition ?  ?   ?OT Treatment/Interventions: Self-care/ADL training;Therapeutic exercise;Neuromuscular education;DME and/or AE instruction;Therapeutic activities;Patient/family education;Balance training  ?  ?OT Goals(Current goals can be found in the care plan section) Acute Rehab OT Goals ?Patient Stated Goal: to be more alert ?OT Goal Formulation: With family ?Time For Goal Achievement: 10/31/21 ?Potential to Achieve Goals: Fair ?ADL Goals ?Pt Will Perform Eating: with min assist;sitting ?Pt Will Perform Grooming: with min assist;sitting ?Additional ADL Goal #1: pt will follow at least 30% of 1-step instructions  ?OT Frequency: Min 2X/week ?  ? ?  Co-evaluation PT/OT/SLP Co-Evaluation/Treatment: Yes ?Reason for Co-Treatment: Complexity of the patient's impairments (multi-system involvement);Necessary to address cognition/behavior during functional activity;For patient/therapist safety ?PT goals addressed during session: Mobility/safety with mobility;Balance ?OT goals addressed during session: ADL's and self-care ?  ? ?  ?AM-PAC OT "6 Clicks" Daily Activity     ?Outcome Measure Help from another person eating meals?: A Lot ?Help from another person taking care of personal grooming?: A Lot ?Help from another person toileting, which includes using toliet,  bedpan, or urinal?: Total ?Help from another person bathing (including washing, rinsing, drying)?: Total ?Help from another person to put on and taking off regular upper body clothing?: A Lot ?Help from another person to

## 2021-10-18 ENCOUNTER — Observation Stay
Admit: 2021-10-18 | Discharge: 2021-10-18 | Disposition: A | Discharge status: Home / Self Care | Payer: Medicare Other | Attending: Internal Medicine | Admitting: Internal Medicine

## 2021-10-18 DIAGNOSIS — Z6826 Body mass index (BMI) 26.0-26.9, adult: Secondary | ICD-10-CM | POA: Diagnosis not present

## 2021-10-18 DIAGNOSIS — J45909 Unspecified asthma, uncomplicated: Secondary | ICD-10-CM | POA: Diagnosis present

## 2021-10-18 DIAGNOSIS — K219 Gastro-esophageal reflux disease without esophagitis: Secondary | ICD-10-CM | POA: Diagnosis present

## 2021-10-18 DIAGNOSIS — R4182 Altered mental status, unspecified: Secondary | ICD-10-CM | POA: Diagnosis not present

## 2021-10-18 DIAGNOSIS — G9349 Other encephalopathy: Secondary | ICD-10-CM | POA: Diagnosis present

## 2021-10-18 DIAGNOSIS — F0282 Dementia in other diseases classified elsewhere, unspecified severity, with psychotic disturbance: Secondary | ICD-10-CM | POA: Diagnosis present

## 2021-10-18 DIAGNOSIS — R410 Disorientation, unspecified: Secondary | ICD-10-CM | POA: Diagnosis present

## 2021-10-18 DIAGNOSIS — I6521 Occlusion and stenosis of right carotid artery: Secondary | ICD-10-CM | POA: Diagnosis present

## 2021-10-18 DIAGNOSIS — E440 Moderate protein-calorie malnutrition: Secondary | ICD-10-CM | POA: Diagnosis present

## 2021-10-18 DIAGNOSIS — R29703 NIHSS score 3: Secondary | ICD-10-CM | POA: Diagnosis present

## 2021-10-18 DIAGNOSIS — W19XXXA Unspecified fall, initial encounter: Secondary | ICD-10-CM | POA: Diagnosis not present

## 2021-10-18 DIAGNOSIS — G459 Transient cerebral ischemic attack, unspecified: Secondary | ICD-10-CM

## 2021-10-18 DIAGNOSIS — F02818 Dementia in other diseases classified elsewhere, unspecified severity, with other behavioral disturbance: Secondary | ICD-10-CM | POA: Diagnosis present

## 2021-10-18 DIAGNOSIS — Z66 Do not resuscitate: Secondary | ICD-10-CM | POA: Diagnosis present

## 2021-10-18 DIAGNOSIS — Z8546 Personal history of malignant neoplasm of prostate: Secondary | ICD-10-CM | POA: Diagnosis not present

## 2021-10-18 DIAGNOSIS — I639 Cerebral infarction, unspecified: Secondary | ICD-10-CM | POA: Diagnosis present

## 2021-10-18 DIAGNOSIS — I110 Hypertensive heart disease with heart failure: Secondary | ICD-10-CM | POA: Diagnosis present

## 2021-10-18 DIAGNOSIS — M542 Cervicalgia: Secondary | ICD-10-CM | POA: Diagnosis present

## 2021-10-18 DIAGNOSIS — R000 Tachycardia, unspecified: Secondary | ICD-10-CM | POA: Diagnosis not present

## 2021-10-18 DIAGNOSIS — I4892 Unspecified atrial flutter: Secondary | ICD-10-CM | POA: Diagnosis present

## 2021-10-18 DIAGNOSIS — G309 Alzheimer's disease, unspecified: Secondary | ICD-10-CM | POA: Diagnosis not present

## 2021-10-18 DIAGNOSIS — I2510 Atherosclerotic heart disease of native coronary artery without angina pectoris: Secondary | ICD-10-CM | POA: Diagnosis present

## 2021-10-18 DIAGNOSIS — R1310 Dysphagia, unspecified: Secondary | ICD-10-CM | POA: Diagnosis present

## 2021-10-18 DIAGNOSIS — Z20822 Contact with and (suspected) exposure to covid-19: Secondary | ICD-10-CM | POA: Diagnosis present

## 2021-10-18 DIAGNOSIS — Z950 Presence of cardiac pacemaker: Secondary | ICD-10-CM | POA: Diagnosis not present

## 2021-10-18 DIAGNOSIS — E663 Overweight: Secondary | ICD-10-CM | POA: Diagnosis present

## 2021-10-18 DIAGNOSIS — G301 Alzheimer's disease with late onset: Secondary | ICD-10-CM | POA: Diagnosis present

## 2021-10-18 DIAGNOSIS — I495 Sick sinus syndrome: Secondary | ICD-10-CM | POA: Diagnosis present

## 2021-10-18 DIAGNOSIS — I10 Essential (primary) hypertension: Secondary | ICD-10-CM | POA: Diagnosis not present

## 2021-10-18 DIAGNOSIS — M5030 Other cervical disc degeneration, unspecified cervical region: Secondary | ICD-10-CM | POA: Diagnosis present

## 2021-10-18 DIAGNOSIS — R29810 Facial weakness: Secondary | ICD-10-CM | POA: Diagnosis present

## 2021-10-18 DIAGNOSIS — I5042 Chronic combined systolic (congestive) and diastolic (congestive) heart failure: Secondary | ICD-10-CM | POA: Diagnosis present

## 2021-10-18 LAB — CBC
HCT: 49.2 % (ref 39.0–52.0)
Hemoglobin: 14.4 g/dL (ref 13.0–17.0)
MCH: 25 pg — ABNORMAL LOW (ref 26.0–34.0)
MCHC: 29.3 g/dL — ABNORMAL LOW (ref 30.0–36.0)
MCV: 85.3 fL (ref 80.0–100.0)
Platelets: 175 10*3/uL (ref 150–400)
RBC: 5.77 MIL/uL (ref 4.22–5.81)
RDW: 15.3 % (ref 11.5–15.5)
WBC: 13.4 10*3/uL — ABNORMAL HIGH (ref 4.0–10.5)
nRBC: 0 % (ref 0.0–0.2)

## 2021-10-18 LAB — HEMOGLOBIN A1C
Hgb A1c MFr Bld: 5.5 % (ref 4.8–5.6)
Hgb A1c MFr Bld: 5.6 % (ref 4.8–5.6)
Mean Plasma Glucose: 111 mg/dL
Mean Plasma Glucose: 114 mg/dL

## 2021-10-18 LAB — BASIC METABOLIC PANEL
Anion gap: 10 (ref 5–15)
BUN: 24 mg/dL — ABNORMAL HIGH (ref 8–23)
CO2: 34 mmol/L — ABNORMAL HIGH (ref 22–32)
Calcium: 8.4 mg/dL — ABNORMAL LOW (ref 8.9–10.3)
Chloride: 96 mmol/L — ABNORMAL LOW (ref 98–111)
Creatinine, Ser: 1.23 mg/dL (ref 0.61–1.24)
GFR, Estimated: 56 mL/min — ABNORMAL LOW (ref >60)
Glucose, Bld: 85 mg/dL (ref 70–99)
Potassium: 4 mmol/L (ref 3.5–5.1)
Sodium: 140 mmol/L (ref 135–145)

## 2021-10-18 LAB — MAGNESIUM: Magnesium: 2.1 mg/dL (ref 1.7–2.4)

## 2021-10-18 MED ORDER — ACETAMINOPHEN 500 MG PO TABS
1000.0000 mg | ORAL_TABLET | Freq: Three times a day (TID) | ORAL | Status: DC | PRN
  Administered 2021-10-19 – 2021-10-20 (×4): 1000 mg via ORAL
  Filled 2021-10-18 (×5): qty 2

## 2021-10-18 NOTE — TOC Progression Note (Addendum)
Transition of Care (TOC) - Progression Note  ? ? ?Patient Details  ?Name: Harry Vargas ?MRN: [id number] ?Date of Birth: [birth year] ? ?Transition of Care (TOC) CM/SW Contact  ?Pete Pelt, RN ?Phone Number: ?10/18/2021, 9:47 AM ? ?Clinical Narrative:  Bed search sent today, as it was not sent previously.  RNCM will follow up with family/patient when offers made.  TOC to follow. ? ?Addendum 1441:  Family accepted [facility] in [place].  Rickey aware.  Waiting on Medical Discharge for transfer.  TOC to follow. ? ?  ?  ? ?Expected Discharge Plan and Services ?  ?  ?  ?  ?  ?                ?  ?  ?  ?  ?  ?  ?  ?  ?  ?  ? ? ?Social Determinants of Health (SDOH) Interventions ?  ? ?Readmission Risk Interventions ?No flowsheet data found. ? ?

## 2021-10-18 NOTE — Evaluation (Signed)
Clinical/Bedside Swallow Evaluation ?Patient Details  ?Name: Harry Vargas ?MRN: [id number] ?Date of Birth: Jun 29, 1929 ? ?Today's Date: 10/18/2021 ?Time: SLP Start Time (ACUTE ONLY): 1120 SLP Stop Time (ACUTE ONLY): 1140 ?SLP Time Calculation (min) (ACUTE ONLY): 20 min ? ?Past Medical History:  ?Past Medical History:  ?Diagnosis Date  ? Alzheimer's disease ([place])   ? Arthritis   ? Asthma   ? B12 deficiency   ? Bleeding disorder ([place])   ? Coronary artery disease   ? Dysrhythmia   ? atrial flutter  ? GERD (gastroesophageal reflux disease)   ? Hyperlipidemia   ? Hypertension   ? NSVT (nonsustained ventricular tachycardia)   ? Prostate cancer ([place]) 1990  ? Takes Lupron shots  ? Sinus node dysfunction ([facility])   ? ?Past Surgical History:  ?Past Surgical History:  ?Procedure Laterality Date  ? BYPASS GRAFT    ? CHOLECYSTECTOMY    ? CIRCUMCISION    ? COLONOSCOPY WITH PROPOFOL N/A 09/19/2019  ? Procedure: COLONOSCOPY WITH PROPOFOL;  Surgeon: Toledo, [name], MD;  Location: [facility] ENDOSCOPY;  Service: Gastroenterology;  Laterality: N/A;  COVID - 60 done at [facility], report on chart  ? CORONARY ARTERY BYPASS GRAFT    ? ESOPHAGOGASTRODUODENOSCOPY (EGD) WITH PROPOFOL N/A 09/19/2019  ? Procedure: ESOPHAGOGASTRODUODENOSCOPY (EGD) WITH PROPOFOL;  Surgeon: Toledo, [name], MD;  Location: [facility] ENDOSCOPY;  Service: Gastroenterology;  Laterality: N/A;  ? INSERT / REPLACE / REMOVE PACEMAKER    ? ?HPI:  ?Per 82 H&P "Harry Vargas is a 88 y.o. male with medical history significant of Htn, CAD/ pacemaker,  presenting with fall and right sided facial droop. On my eval facial droop is absent. Pt is smiling and is symmetrical. He is cooperative and daughter at bedside says she went to her home for few hours and he fell then. Usually he is able to ambulate and moves around and stays up all night. " Head CT 10/17/21 "No evidence of acute intracranial abnormality." CXR, 10/16/21, "Improved bilateral interstitial opacities/edema. No other  significant change." ?  ?Assessment / Plan / Recommendation  ?Clinical Impression ? Pt seen for clinical swallowing evaluation. Pt alert, pleasant, and cooperative. Requires cueing. HOH. Blind in L eye. Daughter at bedside who endorsed pt consumed a pureed diet with thin liquids PTA due edentulism and hx of dementia. Daughter noted pt without s/sx pharyngeal dysphagia on baseline diet. Pt currently on a soft diet with thin liquids this admission. Daughter noted she mashes soft solids further to make it easier for pt to chew and simulate a more pureed diet.  ? ?Pt chart review,  current temp WNL and WBC elevated. CXR, 10/16/21 "Improved bilateral interstitial opacities/edema. No other significant change." ? ?Pt given trials of pureed and thin liquids (via straw). Solid trials deferred due to pt's baseline functional status. Oral phase was functional for consistencies assessed. Pharyngeal swallow appeared [facility] per clinical assessment. No overt or subtle s/sx pharyngeal dysphagia noted. To palpation, pt with seemingly timely swallow initiation and seemingly adequate laryngeal elevation. No change to vocal quality across trials.  ? ?Recommend downgrade to pureed diet with thin liquids and safe swallowing strategies/aspiration precautions as outline below.  ? ?Pt is at mildly increased risk fo aspiration/aspiration PNA given mental status/cognitive status (hx dementia), dental status (edentulism), need for assistance with feeding, and multiple medical comorbidites (including GERD. Risk appears to be reduced with altered consistency diet and utilization of safe swallowing strategies/aspiration precautions. ? ?Recommend RD f/u as daughter concerned re: pt maintaining adequate nutrition  on pureed diet.  ? ?SLP to sign off as pt at swallowing baseline and daughter endorses pt is "very close" to cognitive-linguistic baseline.  ? ?Pt's daughter and RN made aware of diet recommendations, safe swallowing strategies/aspiration  precautions, and SLP POC. Daughter verbalized understanding/agreement. Communication board in pt's room updated accordingly. ? ? ?SLP Visit Diagnosis: Dysphagia, oral phase (R13.11) ?   ?Aspiration Risk ? Mild aspiration risk;Risk for inadequate nutrition/hydration  ?  ?Diet Recommendation Dysphagia 1 (Puree);Thin liquid  ? ?Medication Administration: Whole meds with puree ?Supervision: Staff to assist with self feeding;Full supervision/cueing for compensatory strategies ?Compensations: Minimize environmental distractions;Slow rate;Small sips/bites ?Postural Changes: Seated upright at 90 degrees;Remain upright for at least 30 minutes after po intake  ?  ?Other  Recommendations Oral Care Recommendations: Oral care QID;Staff/trained caregiver to provide oral care   ? ?Recommendations for follow up therapy are one component of a multi-disciplinary discharge planning process, led by the attending physician.  Recommendations may be updated based on patient status, additional functional criteria and insurance authorization. ? ? ?Prognosis Prognosis for Safe Diet Advancement: Fair (on baseline diet) ?Barriers to Reach Goals: Cognitive deficits  ? ?  ? ?Swallow Study   ?General Date of Onset: 10/16/21 ?HPI: Per 74 H&P "Harry Vargas is a 88 y.o. male with medical history significant of Htn, CAD/ pacemaker,  presenting with fall and right sided facial droop. On my eval facial droop is absent. Pt is smiling and is symmetrical. He is cooperative and daughter at bedside says she went to her home for few hours and he fell then. Usually he is able to ambulate and moves around and stays up all night. " ?Type of Study: Bedside Swallow Evaluation ?Previous Swallow Assessment: unknown ?Diet Prior to this Study: Dysphagia 3 (soft);Thin liquids ?Temperature Spikes Noted: Yes ?Respiratory Status: Nasal cannula (2L/min) ?Behavior/Cognition: Alert;Cooperative;Pleasant mood;Requires cueing [facility]) ?Oral Care Completed by SLP:  Recent completion by staff ?Oral Cavity - Dentition: Edentulous (owned dentures; pt "threw out" x2 sets per daughter) ?Vision:  (visual deficits; feeds self at baseline per daughter) ?Self-Feeding Abilities: Total assist ?Patient Positioning: Upright in bed ?Baseline Vocal Quality: Normal ?Volitional Cough: Cognitively unable to elicit ?Volitional Swallow: Unable to elicit  ?  ?Oral/Motor/Sensory Function Overall Oral Motor/Sensory Function: Within functional limits   ?Thin Liquid Thin Liquid: Within functional limits ?Presentation: Straw  ?  ?Puree Puree: Within functional limits ?Presentation: Spoon   ?   ?Cherrie Gauze, M.S., CCC-SLP ?Speech-Language Pathologist ?[facility] ?[phone] ([place])  ? ?Quintella Baton ?10/18/2021,2:48 PM ? ? ? ?

## 2021-10-18 NOTE — Assessment & Plan Note (Addendum)
Whole-body pain ?pt complained of neck pain and didn't want to his neck.  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  Also could be muscular strain due to lying in same position for prolonged period of time.  Seen by physical therapy and will get PT and OT at skilled nursing. ?

## 2021-10-18 NOTE — Progress Notes (Signed)
*  PRELIMINARY RESULTS* ?Echocardiogram ?2D Echocardiogram has been performed. ? ?Harry Vargas ?10/18/2021, 10:04 AM ?

## 2021-10-18 NOTE — Progress Notes (Addendum)
?  Progress Note ? ? ?Patient: Harry Vargas HEN:[id number] DOB: [birth year] DOA: 10/16/2021     0 ?DOS: the patient was seen and examined on 10/18/2021 ?  ?Brief hospital course: ?Harry Vargas is a 88 y.o. male with medical history significant of HTN, CAD, pacemaker, dementia presenting with fall and right sided facial droop. ? ?Assessment and Plan: ?* Falls ?--possible related to stroke or general decline ?--PT/OT when pt becomes more alert ? ? ? ?Benign essential hypertension ?--hold home lasix ?Prn hydralazine to SBP 220 and above.  ? ? ? ?3-vessel CAD ?Cont DAPT. ?Cont statin therapy.  ? ? ?Alzheimer's dementia with behavioral disturbance ([name]) ?Patient has advanced dementia, most recently with behavioral disturbance starting about one week ago for which risperdal was started by outpatient neurologist for hallucinations. ?--Risperdal substituted with seroquel by neuro, however, pt became very somnolent, so seroquel d/c'ed ?Plan: ?--hold home risperdal ?--cont home donepezil ? ?Atrial flutter ([place]) ?Pt in sinus rhythm and rate controlled. ? ?Neck pain ?--pt complained of neck pain and didn't want to his neck.  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  Also could be muscular strain due to lying in same position for prolonged period of time. ?--tylenol PRN for pain ?--avoid sedating pain meds  ? ?Somnolence ?--pt was staying up all night PTA, however, has been very somnolent since presentation, possibly due to seroquel (ordered new in place of home resperidone) and gabapentin.  Seroquel and home gabapentin d/c'ed ?Plan: ?--avoid sedating meds ? ?Facial droop due to acute stroke [facility]) ?# Acute ischemic stroke ?--dx by Neuro based on exam.  MRI brain was not obtained due to PM. ?--CTA head/neck showed critical R ICA stenosis, which was known to daughter. ?--pt was already taking both ASA and plavix PTA. ?Plan: ?- Permissive HTN x48 hrs from sx onset goal BP <220/110. PRN  labetalol or hydralazine if BP above these parameters. Avoid oral antihypertensives. ?- TTE ?--cont statin ?--cont ASA and plavix ?--monitor on tele for Afib ?- Amb referral to neurology upon discharge ? ? ? ? ?  ? ?Subjective:  ?Pt was more alert and awake today.  Complained of pain in his neck and refused to move his neck. ? ? ?Physical Exam: ? ?Constitutional: NAD, lethargic but arousable ?HEENT: conjunctivae and lids normal, EOMI ?CV: No cyanosis.   ?RESP: normal respiratory effort ?SKIN: warm, dry ?Neuro: II - XII grossly intact.   ? ? ?Data Reviewed: ? ?Family Communication: daughter updated at bedside today ? ?Disposition: ?Status is: Observation ? ? Planned Discharge Destination: Skilled nursing facility ? ? ? ?Time spent: 50 minutes ? ?Author: ?Enzo Bi, MD ?10/18/2021 7:20 PM ? ?For on call review [email or web address].  ?

## 2021-10-18 NOTE — Progress Notes (Signed)
NEUROLOGY PROGRESS NOTE ? ?S: Patient care signed out by Dr. Quinn Axe.  Patient seen and examined this morning.  Awake alert cooperative with the exam.  Daughter at bedside.  No new complaints ? ?O: ?Vitals:  ? 10/18/21 0455 10/18/21 0710  ?BP: 129/71 133/67  ?Pulse: 78 77  ?Resp: 17 20  ?Temp: (!) 97.2 ?F (36.2 ?C) 97.8 ?F (36.6 ?C)  ?SpO2: 95% 94%  ? ?Physical Exam ?General: Awake alert, able to tell me that he seems to be somewhere that he does not recognize and asked me if he was in [place] or [place]. ?HEENT: Normocephalic atraumatic ?Chest clear ?Cardiovascular: Regular rate rhythm ?Abdomen nondistended nontender ?Extremities warm well perfused ?Neurological exam ?Awake, alert, oriented to self.  Not oriented to the place. ?Naming intact ?Repetition intact ?Mild dysarthria ?Comprehension for simple commands intact.  Diminished attention concentration: Some hesitancy and following complex commands ?Cranial nerves II to XII intact ?Motor examination with antigravity bilateral upper extremity.  Does not move bilateral lower extremities antigravity but is able to wiggle toes bilaterally ?Sensation intact ?Coordination: No gross dysmetria ?Gait deferred ? ?Labs ?A1c 5.6 ?LDL 88.3 ? ?B12 - 667 ? ?Imaging personally reviewed ?CT head code stroke-no acute changes ?Repeat head CT without any change from the initial CT. ?MRI cannot be done due to pacemaker ?CTA head and neck: Critical proximal right ICA stenosis with small string of contrast/patency.  More distal ICA in the neck and intracranially is patent but small in caliber.  Approximately 70% stenosis of the proximal left ICA in the neck.  Severe bilateral vertebral artery origin stenosis.  Moderate right paraclinoid ICA stenosis.  Aortic atherosclerosis. ? ?Assessment: ?88 year old gentleman with past medical history significant for Alzheimer's disease advanced, B12 deficiency, CAD, hyperlipidemia, hypertension, prostate cancer who is brought in by EMS after a fall  with acute onset of right facial droop.  Patient has advanced dementia, most recently with behavioral disturbance starting about one week ago for which risperdal was started by outpatient neurologist for hallucinations.  Was noticed to be encephalopathic and confused and excessively somnolent-likely related to medications-Risperdal. That was changed to Seroquel, and that too made him drowsy. Not with discontinuation of Seroquel, mentation is improved. ? ?I agree with Dr. Quinn Axe that  his encephalopathy likely represents progression of his underlying dementia, and he will require PT/OT evaluation to determine safe dispo.  ?Repeat head CT today showing NAICP is reassuring. ? ?Due to his presentation of focal right facial droop, stroke work-up was pursued including head and neck imaging patient has critical R ICA stenosis with angiographic string sign as well as 70% stenosis of the proximal left ICA in the neck and severe bilateral vertebral artery origin stenosis--his clinical presentation of right facial droop could be a left hemispheric TIA/stroke or a brainstem TIA or stroke.  Repeat CT with no evidence of a large evolving infarct. ? ?Dr. Quinn Axe had D/w daughter Nadara Eaton at bedside, they are aware of critical right ICA stenosis (although that is not likely to be symptomatic for this presentation which was right facial weakness )  and were seen by vascular surgery for same earlier this month.  Family has had detailed discussions with vascular surgery and do not wish to pursue revascularization given advanced age and risk associated with revascularization while understanding that this might put him at high risk for strokes due to acute on chronic occlusion or hemodynamic insufficiency. ? ?PLAN ?# Acute ischemic stroke/TIA -unable to get MRI due to pacemaker ?- Permissive HTN x48  hrs from sx onset goal BP <220/110. PRN labetalol or hydralazine if BP above these parameters. Avoid oral antihypertensives. ?- f/u TTE  please call with any concerning findings.  Results pending at the time of this dictation. ?-A1c within goal ?-LDL greater than 70.  Continue on atorvastatin 40 now and daily. ?- ASA '81mg'$  daily + plavix '75mg'$  daily  - given his critical ICA stenosis I would leave him on DAPT indefinitely ?- q4 hr neuro checks ?- STAT head CT for any change in neuro exam ?-While inpatient, continue tele to look for any evidence of atrial fibrillation ?- PT/OT/SLP ?- Stroke education ?- Amb referral to neurology upon discharge ?  ?# Dementia with behavioral disturbance ?- D/c seroquel and gabapentin ?- Avoid deliriogenic meds ?- C/W Aricept and Namenda  ?- PT/OT eval ? ?Neurology will be available as needed ?Please call with questions ? ?Plan was relayed to Dr. Billie Ruddy ? ?-- ?Amie Portland, MD ?Neurologist ?[facility] ?Pager: [phone] ?

## 2021-10-18 NOTE — Progress Notes (Signed)
SLP Cancellation Note ? ?Patient Details ?Name: Harry Vargas ?MRN: [id number] ?DOB: 10/06/1928 ? ? ?Cancelled treatment:       Reason Eval/Treat Not Completed: Patient at procedure or test/unavailable  ? ?SLP consult received and appreciated. Chart review completed. Pt unable to participate in SLP evaluation as pt currently OTF for echocardiogram with bubble study.  ? ?Will continue efforts as appropriate. ? ? ?Cherrie Gauze, M.S., CCC-SLP ?Speech-Language Pathologist ?[facility] ?[phone] ([place])  ? ?Harry Vargas ?10/18/2021, 8:50 AM ?

## 2021-10-18 NOTE — Progress Notes (Signed)
Physical Therapy Treatment ?Patient Details ?Name: Harry Vargas ?MRN: [id number] ?DOB: October 07, 1928 ?Today's Date: 10/18/2021 ? ? ?History of Present Illness Pt is a 88 y/o M admitted on 10/16/21 after presenting with c/o fall & R sided facial droop. Pt noted to have critical R ICA stenosis. PMH: HTN, CAD, pacemaker, arthritis, Alzheimer's dementia, GERD, HLD, HTN, NSVT, prostate CA, sinus node dysfunction ? ?  ?PT Comments  ? ? Pt received supine in bed. Able to arouse to light touch and voice. Pt conversant today and is appropriate with conversation. Pt encouraged and educated on importance of OOB mobility for skin integrity, strength, and back pain but pt declining due to back pain despite education. Pt agreeable to bed level exercise with daughter educated on performance of LE therex at least 2-3x/day to maintain muscle and joint mobility if pt is not agreeable to participate in OOB mobility. With frequent multimodal cuing pt participates and mostly follows all single step commands with increased time. All needs in reach, STR remains appropriate at this time. ?  ?Recommendations for follow up therapy are one component of a multi-disciplinary discharge planning process, led by the attending physician.  Recommendations may be updated based on patient status, additional functional criteria and insurance authorization. ? ?Follow Up Recommendations ? Skilled nursing-short term rehab (<3 hours/day) ?  ?  ?Assistance Recommended at Discharge Frequent or constant Supervision/Assistance  ?Patient can return home with the following Two people to help with walking and/or transfers;Two people to help with bathing/dressing/bathroom;Direct supervision/assist for medications management;Assistance with feeding;Assist for transportation;Help with stairs or ramp for entrance;Assistance with cooking/housework;Direct supervision/assist for financial management ?  ?Equipment Recommendations ? None recommended by PT  ?  ?Recommendations  for Other Services   ? ? ?  ?Precautions / Restrictions Precautions ?Precautions: Fall ?Restrictions ?Weight Bearing Restrictions: No  ?  ? ?Mobility ? Bed Mobility ?  ?  ?  ?  ?  ?  ?  ?General bed mobility comments: in supine all of session ?Patient Response: Cooperative, Flat affect ? ?Transfers ?  ?  ?  ?  ?  ?  ?  ?  ?  ?  ?  ? ?Ambulation/Gait ?  ?  ?  ?  ?  ?  ?  ?General Gait Details: N/A ? ? ?Stairs ?  ?  ?  ?  ?  ? ? ?Wheelchair Mobility ?  ? ?Modified Rankin (Stroke Patients Only) ?  ? ? ?  ?Balance   ?  ?  ?  ?  ?  ?  ?  ?  ?  ?  ?  ?  ?  ?  ?  ?  ?  ?  ?  ? ?  ?Cognition Arousal/Alertness: Lethargic ?Behavior During Therapy: Flat affect ?Overall Cognitive Status: History of cognitive impairments - at baseline ?  ?  ?  ?  ?  ?  ?  ?  ?  ?  ?  ?  ?  ?  ?  ?  ?General Comments: Able to intermittently follow commands with increased time and multimodal cues ?  ?  ? ?  ?Exercises General Exercises - Lower Extremity ?Ankle Circles/Pumps: AROM, Strengthening, Both, 15 reps, Supine ?Short Arc Quad: AROM, Strengthening, Both, 15 reps, Supine ?Heel Slides: AROM, Strengthening, Both, 15 reps, Supine ?Hip ABduction/ADduction: AROM, AAROM, Both, 15 reps ?Other Exercises ?Other Exercises: Importance of OOB mobility for skin integrity, back pain, and strength ? ?  ?General Comments   ?  ?  ? ?  Pertinent Vitals/Pain Pain Assessment ?Pain Assessment: Faces ?Faces Pain Scale: Hurts a little bit ?Pain Descriptors / Indicators: Grimacing ?Pain Intervention(s): Monitored during session  ? ? ?Home Living   ?  ?  ?  ?  ?  ?  ?  ?  ?  ?   ?  ?Prior Function    ?  ?  ?   ? ?PT Goals (current goals can now be found in the care plan section) Acute Rehab PT Goals ?Patient Stated Goal: to get better ?PT Goal Formulation: With family ?Time For Goal Achievement: 10/31/21 ?Potential to Achieve Goals: Fair ?Progress towards PT goals: Progressing toward goals ? ?  ?Frequency ? ? ? Min 2X/week ? ? ? ?  ?PT Plan Current plan remains  appropriate  ? ? ?Co-evaluation   ?  ?  ?  ?  ? ?  ?AM-PAC PT "6 Clicks" Mobility   ?Outcome Measure ? Help needed turning from your back to your side while in a flat bed without using bedrails?: Total ?Help needed moving from lying on your back to sitting on the side of a flat bed without using bedrails?: Total ?Help needed moving to and from a bed to a chair (including a wheelchair)?: Total ?Help needed standing up from a chair using your arms (e.g., wheelchair or bedside chair)?: Total ?Help needed to walk in hospital room?: Total ?Help needed climbing 3-5 steps with a railing? : Total ?6 Click Score: 6 ? ?  ?End of Session Equipment Utilized During Treatment: Oxygen ?Activity Tolerance: Patient limited by lethargy ?Patient left: in bed;with call bell/phone within reach;with family/visitor present ?Nurse Communication: Mobility status ?PT Visit Diagnosis: Unsteadiness on feet (R26.81);Muscle weakness (generalized) (M62.81);Difficulty in walking, not elsewhere classified (R26.2);Other abnormalities of gait and mobility (R26.89) ?  ? ? ?Time: 6010-9323 ?PT Time Calculation (min) (ACUTE ONLY): 13 min ? ?Charges:  $Therapeutic Exercise: 8-22 mins          ?          ? ?Salem Caster. Fairly IV, PT, DPT ?Physical Therapist- [place]  ?[facility]  ?10/18/2021, 3:39 PM ? ?

## 2021-10-19 DIAGNOSIS — E440 Moderate protein-calorie malnutrition: Secondary | ICD-10-CM | POA: Diagnosis present

## 2021-10-19 DIAGNOSIS — R000 Tachycardia, unspecified: Secondary | ICD-10-CM

## 2021-10-19 DIAGNOSIS — W19XXXA Unspecified fall, initial encounter: Secondary | ICD-10-CM | POA: Diagnosis not present

## 2021-10-19 DIAGNOSIS — I639 Cerebral infarction, unspecified: Secondary | ICD-10-CM | POA: Diagnosis not present

## 2021-10-19 DIAGNOSIS — R4182 Altered mental status, unspecified: Secondary | ICD-10-CM | POA: Diagnosis not present

## 2021-10-19 LAB — CBC
HCT: 47.1 % (ref 39.0–52.0)
Hemoglobin: 14.2 g/dL (ref 13.0–17.0)
MCH: 25.1 pg — ABNORMAL LOW (ref 26.0–34.0)
MCHC: 30.1 g/dL (ref 30.0–36.0)
MCV: 83.2 fL (ref 80.0–100.0)
Platelets: 171 10*3/uL (ref 150–400)
RBC: 5.66 MIL/uL (ref 4.22–5.81)
RDW: 15.3 % (ref 11.5–15.5)
WBC: 11.6 10*3/uL — ABNORMAL HIGH (ref 4.0–10.5)
nRBC: 0 % (ref 0.0–0.2)

## 2021-10-19 LAB — BASIC METABOLIC PANEL
Anion gap: 6 (ref 5–15)
BUN: 31 mg/dL — ABNORMAL HIGH (ref 8–23)
CO2: 37 mmol/L — ABNORMAL HIGH (ref 22–32)
Calcium: 8.5 mg/dL — ABNORMAL LOW (ref 8.9–10.3)
Chloride: 98 mmol/L (ref 98–111)
Creatinine, Ser: 1.09 mg/dL (ref 0.61–1.24)
GFR, Estimated: >60 mL/min (ref >60)
Glucose, Bld: 104 mg/dL — ABNORMAL HIGH (ref 70–99)
Potassium: 4 mmol/L (ref 3.5–5.1)
Sodium: 141 mmol/L (ref 135–145)

## 2021-10-19 LAB — ECHOCARDIOGRAM COMPLETE BUBBLE STUDY
AR max vel: 1.81 cm2
AV Area VTI: 2.29 cm2
AV Area mean vel: 2 cm2
AV Mean grad: 6 mmHg
AV Peak grad: 11.8 mmHg
Ao pk vel: 1.72 m/s
Area-P 1/2: 3.21 cm2
MV VTI: 2.66 cm2
S' Lateral: 3.64 cm

## 2021-10-19 LAB — MAGNESIUM: Magnesium: 2.2 mg/dL (ref 1.7–2.4)

## 2021-10-19 MED ORDER — SODIUM CHLORIDE 0.9 % IV BOLUS: 500.0000 mL | Freq: Once | INTRAVENOUS | Status: DC

## 2021-10-19 MED ORDER — HYDROCODONE-ACETAMINOPHEN 5-325 MG PO TABS: 1.0000 | ORAL_TABLET | ORAL | Status: DC | PRN

## 2021-10-19 MED ORDER — MORPHINE SULFATE (PF) 2 MG/ML IV SOLN
0.5000 mg | Freq: Once | INTRAVENOUS | Status: AC
  Administered 2021-10-19: 0.5 mg via INTRAVENOUS
  Filled 2021-10-19: qty 1

## 2021-10-19 MED ORDER — ENSURE MAX PROTEIN PO LIQD
11.0000 [oz_av] | Freq: Two times a day (BID) | ORAL | Status: DC
  Administered 2021-10-19 – 2021-10-21 (×5): 11 [oz_av] via ORAL
  Filled 2021-10-19: qty 330

## 2021-10-19 MED ORDER — ADULT MULTIVITAMIN W/MINERALS CH
1.0000 | ORAL_TABLET | Freq: Every day | ORAL | Status: DC
  Administered 2021-10-20 – 2021-10-21 (×2): 1 via ORAL
  Filled 2021-10-19 (×2): qty 1

## 2021-10-19 NOTE — Assessment & Plan Note (Addendum)
Nutrition Status: ?Nutrition Problem: Moderate Malnutrition ?Etiology: social / environmental circumstances (dementia, advanced age) ?Signs/Symptoms: moderate fat depletion, moderate muscle depletion ?  ?Seen by nutrition.  Put on Magic cup 3 times daily, multivitamin and Ensure supplement 3 times daily. ? ? ?

## 2021-10-19 NOTE — TOC Progression Note (Signed)
Transition of Care (TOC) - Progression Note  ? ? ?Patient Details  ?Name: GIAVONNI FONDER ?MRN: [id number] ?Date of Birth: 1928/08/06 ? ?Transition of Care (TOC) CM/SW Contact  ?Pete Pelt, RN ?Phone Number: ?10/19/2021, 1:26 PM ? ?Clinical Narrative:   Daughter is still Scientist, product/process development, however she would like to tour facility today to ask questions.  Awaiting medical discharge for transfer to facility. ? ? ? ?  ?  ? ?Expected Discharge Plan and Services ?  ?  ?  ?  ?  ?                ?  ?  ?  ?  ?  ?  ?  ?  ?  ?  ? ? ?Social Determinants of Health (SDOH) Interventions ?  ? ?Readmission Risk Interventions ?No flowsheet data found. ? ?

## 2021-10-19 NOTE — Progress Notes (Signed)
Occupational Therapy Treatment ?Patient Details ?Name: Harry Vargas ?MRN: [id number] ?DOB: Sep 25, 1928 ?Today's Date: 10/19/2021 ? ? ?History of present illness Pt is a 88 y/o M admitted on 10/16/21 after presenting with c/o fall & R sided facial droop. Pt noted to have critical R ICA stenosis. PMH: HTN, CAD, pacemaker, arthritis, Alzheimer's dementia, GERD, HLD, HTN, NSVT, prostate CA, sinus node dysfunction ?  ?OT comments ? Upon entering the room, pt working with physical therapy but transitions easily to OT intervention. Pt engaged in several sit <>stands from EOB with min A and use of RW. Pt stands for ~ 2-3 minutes each time . Pt marches in place x 10 reps with min A for standing balance. Pt remains on 2Ls via [place] during session with O2 saturation being 94% or better. He does fatigue quickly. Sit >supine with mod A. All needs within reach and daughter remains present. Pt continues to benefit from OT intervention.   ? ?Recommendations for follow up therapy are one component of a multi-disciplinary discharge planning process, led by the attending physician.  Recommendations may be updated based on patient status, additional functional criteria and insurance authorization. ?   ?Follow Up Recommendations ? Skilled nursing-short term rehab (<3 hours/day)  ?  ?Assistance Recommended at Discharge Frequent or constant Supervision/Assistance  ?Patient can return home with the following ? A lot of help with walking and/or transfers;A lot of help with bathing/dressing/bathroom;Assistance with cooking/housework;Assistance with feeding;Help with stairs or ramp for entrance ?  ?Equipment Recommendations ? Other (comment) (defer to next venue of care)  ?  ?   ?Precautions / Restrictions Precautions ?Precautions: Fall ?Restrictions ?Weight Bearing Restrictions: No  ? ? ?  ? ?Mobility Bed Mobility ?Overal bed mobility: Needs Assistance ?Bed Mobility: Sit to Supine ?  ?  ?  ?Sit to supine: Max assist ?  ?  ?  ? ?Transfers ?Overall  transfer level: Needs assistance ?Equipment used: Rolling walker (2 wheels) ?Transfers: Sit to/from Stand ?Sit to Stand: Min assist ?  ?  ?  ?  ?  ?General transfer comment: VC's for hand placement ?  ?  ?Balance Overall balance assessment: Needs assistance ?Sitting-balance support: Feet supported, Bilateral upper extremity supported ?Sitting balance-Leahy Scale: Fair ?Sitting balance - Comments: supervision ?  ?Standing balance support: Bilateral upper extremity supported, During functional activity ?Standing balance-Leahy Scale: Fair ?Standing balance comment: use of RW ?  ?  ?  ?  ?  ?  ?  ?  ?  ?  ?  ?   ? ?ADL either performed or assessed with clinical judgement  ? ? ?Extremity/Trunk Assessment Upper Extremity Assessment ?Upper Extremity Assessment: Generalized weakness ?  ?Lower Extremity Assessment ?Lower Extremity Assessment: Generalized weakness ?  ?  ?  ? ?Vision Baseline Vision/History: 2 Legally blind ?Patient Visual Report: No change from baseline ?  ?  ?   ?   ? ?Cognition Arousal/Alertness: Lethargic ?Behavior During Therapy: Flat affect ?Overall Cognitive Status: History of cognitive impairments - at baseline ?  ?  ?  ?  ?  ?  ?  ?  ?  ?  ?  ?  ?  ?  ?  ?  ?General Comments: More conversational today, willing to participate in OOB activity. Very pleasant and cooperative, awakens more once seated EOB. ?  ?  ?   ?   ?   ?General Comments SPO2 in mid 90's at rest on 2L/min  ? ? ?Pertinent Vitals/ Pain  Pain Assessment ?Pain Assessment: No/denies pain ? ?   ?   ? ?Frequency ? Min 2X/week  ? ? ? ? ?  ?Progress Toward Goals ? ?OT Goals(current goals can now be found in the care plan section) ? Progress towards OT goals: Progressing toward goals ? ?Acute Rehab OT Goals ?Patient Stated Goal: to get stronger ?OT Goal Formulation: With family ?Time For Goal Achievement: 10/31/21 ?Potential to Achieve Goals: Fair  ?Plan Discharge plan remains appropriate;Frequency remains appropriate   ? ?   ?AM-PAC OT "6  Clicks" Daily Activity     ?Outcome Measure ? ? Help from another person eating meals?: A Little ?Help from another person taking care of personal grooming?: A Little ?Help from another person toileting, which includes using toliet, bedpan, or urinal?: A Lot ?Help from another person bathing (including washing, rinsing, drying)?: A Lot ?Help from another person to put on and taking off regular upper body clothing?: A Little ?Help from another person to put on and taking off regular lower body clothing?: A Lot ?6 Click Score: 15 ? ?  ?End of Session Equipment Utilized During Treatment: Oxygen (2Ls) ? ?OT Visit Diagnosis: History of falling (Z91.81);Muscle weakness (generalized) (M62.81);Other symptoms and signs involving cognitive function ?  ?Activity Tolerance Patient limited by lethargy ?  ?Patient Left in bed;with call bell/phone within reach;with family/visitor present ?  ?Nurse Communication Mobility status;Other (comment) ?  ? ?   ? ?Time: 7741-2878 ?OT Time Calculation (min): 15 min ? ?Charges: OT General Charges ?$OT Visit: 1 Visit ?OT Treatments ?$Therapeutic Activity: 8-22 mins ? ?Darleen Crocker, [place], OTR/L , [place] ?ascom [phone]  ?10/19/21, 4:17 PM  ?

## 2021-10-19 NOTE — Progress Notes (Signed)
?  Progress Note ? ? ?Patient: Harry Vargas YKZ:[id number] DOB: 1929-06-25 DOA: 10/16/2021     1 ?DOS: the patient was seen and examined on 10/19/2021 ?  ?Brief hospital course: ?Harry Vargas is a 88 y.o. male with medical history significant of HTN, CAD, pacemaker, dementia presenting with fall and right sided facial droop. ? ?Assessment and Plan: ?* Falls ?--possible related to stroke or general decline ?--PT/OT and SNF rehab ? ? ? ?Benign essential hypertension ?--hold home lasix ?Prn hydralazine to SBP 220 and above.  ? ? ? ?3-vessel CAD ?Cont DAPT. ?Cont statin therapy.  ? ? ?Alzheimer's dementia with behavioral disturbance ([place]) ?Patient has advanced dementia, most recently with behavioral disturbance starting about one week ago for which risperdal was started by outpatient neurologist for hallucinations. ?--Risperdal substituted with seroquel by neuro, however, pt became very somnolent, so seroquel d/c'ed ?Plan: ?--hold home risperdal ?--cont home donepezil ? ?Atrial flutter ([place]) ?Pt in sinus rhythm and rate controlled. ? ?Malnutrition of moderate degree ?Supplements per dietician  ? ?Neck pain ?Whole-body pain ?pt complained of neck pain and didn't want to his neck.  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  Also could be muscular strain due to lying in same position for prolonged period of time. ?--Norco PRN for pain, otherwise pt refuses to move ? ?Delirium ?--supportive care ? ?Somnolence ?--pt was staying up all night PTA, however, has been very somnolent since presentation, possibly due to seroquel (ordered new in place of home resperidone) and gabapentin.  Seroquel and home gabapentin d/c'ed ?Plan: ?--avoid sedating meds ? ?Facial droop due to acute stroke [facility]) ?# Acute ischemic stroke ?--dx by Neuro based on exam.  MRI brain was not obtained due to PM. ?--CTA head/neck showed critical R ICA stenosis, which was known to daughter. ?--pt was already taking  both ASA and plavix PTA. ?Plan: ?--cont statin ?--cont ASA and plavix ?--monitor on tele for Afib ?- Amb referral to neurology upon discharge ? ? ? ? ?  ? ?Subjective:  ?Pt complained of whole-body pain when moved.  Daughter also noted pt being very mean, and not himself. ? ? ?Physical Exam: ? ?Constitutional: NAD, eyes closed but awake, oriented to self and place, but intermittently confused  ?HEENT: conjunctivae and lids normal, EOMI ?CV: No cyanosis.   ?RESP: normal respiratory effort ?SKIN: warm, dry ?Psych: grouchy mood and affect.   ? ? ?Data Reviewed: ? ?Family Communication: daughter updated at bedside today ? ?Disposition: ?Status is: changed to inpatient ? ? Planned Discharge Destination: Skilled nursing facility ? ? ? ?Time spent: 50 minutes ? ?Author: ?Enzo Bi, MD ?10/19/2021 7:54 PM ? ?For on call review [email or web address].  ?

## 2021-10-19 NOTE — Progress Notes (Signed)
Initial Nutrition Assessment ? ?DOCUMENTATION CODES:  ? ?Non-severe (moderate) malnutrition in context of social or environmental circumstances ? ?INTERVENTION:  ? ?Ensure Max protein supplement BID, each supplement provides 150kcal and 30g of protein. ? ?Magic cup TID with meals, each supplement provides 290 kcal and 9 grams of protein ? ?MVI po daily  ? ?Pt at moderate refeed risk; recommend monitor potassium, magnesium and phosphorus labs daily until stable ? ?NUTRITION DIAGNOSIS:  ? ?Moderate Malnutrition related to social / environmental circumstances (dementia, advanced age) as evidenced by moderate fat depletion, moderate muscle depletion. ? ?GOAL:  ? ?Patient will meet greater than or equal to 90% of their needs ? ?MONITOR:  ? ?PO intake, Supplement acceptance, Labs, Weight trends, Skin, I & O's ? ?REASON FOR ASSESSMENT:  ? ?Consult ?Assessment of nutrition requirement/status ? ?ASSESSMENT:  ? ?88 year old gentleman with past medical history significant for Alzheimer's disease advanced, B12 deficiency, CAD s/p CABG, hyperlipidemia, hypertension, prostate cancer and pacemaker who is admitted for suspected acute stroke. ? ?Met with pt and pt's daughter in room today. Pt unable to provide nutrition related history r/t dementia so history obtained from daughter at bedside. Daughter reports pt with fair appetite and oral intake at baseline. Daughter has been providing pt with pureed/mashed foods at home along with strawberry protein supplements. Daughter reports that patient did not eat much yesterday or for breakfast this morning but reports that he ate well at lunch. Pt's lunch tray was sitting on his side table and was 90% eaten. RD discussed with daughter the importance of adequate nutrition needed to preserve lean muscle. Discussed ways to add additional protein and calories to pt's meals and discussed the use of supplements. RD also discussed with daughter what to expect nutritionally as pt nears end-stage  dementia. RD will add supplements and vitamins to help pt meet his estimated needs. Pt is likely at refeed risk. Per chart, pt's UBW appears to be ~160-170lbs. Pt is currently down ~9lbs from his UBW; RD unsure how recently weight loss occurred. Plan is for SNF at discharge.  ? ?Medications reviewed and include: aspirin, plavix, lovenox ? ?Labs reviewed: K 4.0 wnl, BUN 31(H), Mg 2.2 wnl ?Wbc- 11.6(H) ? ?NUTRITION - FOCUSED PHYSICAL EXAM: ? ?Flowsheet Row Most Recent Value  ?Orbital Region Moderate depletion  ?Upper Arm Region Severe depletion  ?Thoracic and Lumbar Region Moderate depletion  ?Buccal Region Moderate depletion  ?Temple Region Moderate depletion  ?Clavicle Bone Region Severe depletion  ?Clavicle and Acromion Bone Region Severe depletion  ?Scapular Bone Region Moderate depletion  ?Dorsal Hand Moderate depletion  ?Patellar Region Moderate depletion  ?Anterior Thigh Region Moderate depletion  ?Posterior Calf Region Moderate depletion  ?Edema (RD Assessment) None  ?Hair Reviewed  ?Eyes Reviewed  ?Mouth Reviewed  ?Skin Reviewed  ?Nails Reviewed  ? ?Diet Order:   ?Diet Order   ? ?       ?  DIET - DYS 1 Room service appropriate? Yes with Assist; Fluid consistency: Thin  Diet effective now       ?  ? ?  ?  ? ?  ? ?EDUCATION NEEDS:  ? ?Education needs have been addressed (with pt's family) ? ?Skin:  Skin Assessment: Reviewed RN Assessment (ecchymosis) ? ?Last BM:  3/20 ? ?Height:  ? ?Ht Readings from Last 1 Encounters:  ?10/17/21 $RemoveBe'5\' 4"'ezvUTTSUE$  (1.626 m)  ? ? ?Weight:  ? ?Wt Readings from Last 1 Encounters:  ?10/17/21 71.1 kg  ? ? ?Ideal Body Weight:  59 kg ? ?BMI:  Body mass index is 26.91 kg/m?. ? ?Estimated Nutritional Needs:  ? ?Kcal:  1700-1900kcal/day ? ?Protein:  85-95g/day ? ?Fluid:  1.5-1.7L/day ? ?Koleen Distance MS, RD, LDN ?Please refer to [facility] for RD and/or RD on-call/weekend/after hours pager ? ?

## 2021-10-19 NOTE — Assessment & Plan Note (Signed)
--  supportive care ?

## 2021-10-19 NOTE — Progress Notes (Signed)
Physical Therapy Treatment ?Patient Details ?Name: Harry Vargas ?MRN: [id number] ?DOB: [birth year] ?Today's Date: 10/19/2021 ? ? ?History of Present Illness Pt is a 88 y/o M admitted on 10/16/21 after presenting with c/o fall & R sided facial droop. Pt noted to have critical R ICA stenosis. PMH: HTN, CAD, pacemaker, arthritis, Alzheimer's dementia, GERD, HLD, HTN, NSVT, prostate CA, sinus node dysfunction ? ?  ?PT Comments  ? ? Pt received supine in bed agreeable to PT services. Appears tired and lethargic but conversational and holding eyes open for longer periods of time. Pt resting on 2L/min [place] throughout with SPO2 >/= 90% at rest and with mobility.  MaxA with HOB elevated to transfer to seated Eob with minguard to stand to RW with hand placement with pt tolerating gait within room ~20' at [facility] with RW and minguard from PT. Pt ambulates with small, reciprocal steps with x2 buckling of LE's requiring PT minA+1 to assist. Pt following all single step commands and displaying safe RW sequencing with turns but does have poor hand placement with transfers as pt seated at EOB. PT finishing treatment for OT entering room. Pt progressing in mobility but will still benefit from STR at discharge due to deficits in transfers, strength, and balance.  ?  ?Recommendations for follow up therapy are one component of a multi-disciplinary discharge planning process, led by the attending physician.  Recommendations may be updated based on patient status, additional functional criteria and insurance authorization. ? ?Follow Up Recommendations ? Skilled nursing-short term rehab (<3 hours/day) ?  ?  ?Assistance Recommended at Discharge Frequent or constant Supervision/Assistance  ?Patient can return home with the following Two people to help with walking and/or transfers;Two people to help with bathing/dressing/bathroom;Direct supervision/assist for medications management;Assistance with feeding;Assist for transportation;Help with stairs  or ramp for entrance;Assistance with cooking/housework;Direct supervision/assist for financial management ?  ?Equipment Recommendations ? None recommended by PT  ?  ?Recommendations for Other Services   ? ? ?  ?Precautions / Restrictions Precautions ?Precautions: Fall ?Restrictions ?Weight Bearing Restrictions: No  ?  ? ?Mobility ? Bed Mobility ?Overal bed mobility: Needs Assistance ?Bed Mobility: Supine to Sit ?  ?  ?Supine to sit: HOB elevated, Max assist ?  ?  ?General bed mobility comments: Max A at chuck pad to attain sitting EOB. ?Patient Response: Cooperative, Flat affect ? ?Transfers ?Overall transfer level: Needs assistance ?Equipment used: Rolling walker (2 wheels) ?Transfers: Sit to/from Stand ?Sit to Stand: Min guard, From elevated surface ?  ?  ?  ?  ?  ?General transfer comment: VC's for hand placement ?  ? ?Ambulation/Gait ?Ambulation/Gait assistance: Min guard ?Gait Distance (Feet): 20 Feet ?Assistive device: Rolling walker (2 wheels) ?Gait Pattern/deviations: Decreased step length - right, Decreased step length - left, Step-through pattern ?  ?  ?  ?General Gait Details: Small, reciprocal steps with RW with couple bouts of knee buckling. ? ? ?Stairs ?  ?  ?  ?  ?  ? ? ?Wheelchair Mobility ?  ? ?Modified Rankin (Stroke Patients Only) ?  ? ? ?  ?Balance Overall balance assessment: Needs assistance ?Sitting-balance support: Feet supported, Bilateral upper extremity supported ?Sitting balance-Leahy Scale: Fair ?Sitting balance - Comments: supervision ?  ?Standing balance support: Bilateral upper extremity supported, During functional activity ?Standing balance-Leahy Scale: Fair ?Standing balance comment: use of RW ?  ?  ?  ?  ?  ?  ?  ?  ?  ?  ?  ?  ? ?  ?Cognition  Arousal/Alertness: Lethargic ?Behavior During Therapy: Flat affect ?Overall Cognitive Status: History of cognitive impairments - at baseline ?  ?  ?  ?  ?  ?  ?  ?  ?  ?  ?  ?  ?  ?  ?  ?  ?General Comments: More conversational today, willing  to participate in OOB activity. Very pleasant and cooperative, awakens more once seated EOB. ?  ?  ? ?  ?Exercises General Exercises - Lower Extremity ?Ankle Circles/Pumps: AROM, Strengthening, Both, 15 reps, Supine ?Hip ABduction/ADduction: AROM, AAROM, Both, 15 reps ?Straight Leg Raises: AAROM, Strengthening, Both, 10 reps ? ?  ?General Comments General comments (skin integrity, edema, etc.): SPO2 in mid 90's at rest on 2L/min ?  ?  ? ?Pertinent Vitals/Pain Pain Assessment ?Pain Assessment: No/denies pain  ? ? ?Home Living   ?  ?  ?  ?  ?  ?  ?  ?  ?  ?   ?  ?Prior Function    ?  ?  ?   ? ?PT Goals (current goals can now be found in the care plan section) Acute Rehab PT Goals ?Patient Stated Goal: to get better ?PT Goal Formulation: With family ?Time For Goal Achievement: 10/31/21 ?Potential to Achieve Goals: Good ?Progress towards PT goals: Progressing toward goals ? ?  ?Frequency ? ? ? Min 2X/week ? ? ? ?  ?PT Plan Current plan remains appropriate  ? ? ?Co-evaluation   ?  ?  ?  ?  ? ?  ?AM-PAC PT "6 Clicks" Mobility   ?Outcome Measure ? Help needed turning from your back to your side while in a flat bed without using bedrails?: A Lot ?Help needed moving from lying on your back to sitting on the side of a flat bed without using bedrails?: A Lot ?Help needed moving to and from a bed to a chair (including a wheelchair)?: A Little ?Help needed standing up from a chair using your arms (e.g., wheelchair or bedside chair)?: A Little ?Help needed to walk in hospital room?: A Little ?Help needed climbing 3-5 steps with a railing? : A Lot ?6 Click Score: 15 ? ?  ?End of Session Equipment Utilized During Treatment: Gait belt;Oxygen ?Activity Tolerance: Patient tolerated treatment well ?Patient left: in bed;with call bell/phone within reach;with family/visitor present ?Nurse Communication: Mobility status ?PT Visit Diagnosis: Unsteadiness on feet (R26.81);Muscle weakness (generalized) (M62.81);Difficulty in walking, not  elsewhere classified (R26.2);Other abnormalities of gait and mobility (R26.89) ?  ? ? ?Time: 8250-0370 ?PT Time Calculation (min) (ACUTE ONLY): 15 min ? ?Charges:  $Gait Training: 8-22 mins          ?          ? ?Salem Caster. Fairly IV, PT, DPT ?Physical Therapist- [place]  ?[facility]  ?10/19/2021, 3:51 PM ? ?

## 2021-10-19 NOTE — Progress Notes (Signed)
Chart review note ?Echocardiogram completed-results remain pending ?Please call back if any questions once results are back ?Neurology will be available as needed ? ?-- ?Amie Portland, MD ?Neurologist ?[facility] ?Pager: [phone] ? ? ?

## 2021-10-20 ENCOUNTER — Inpatient Hospital Stay: Payer: Medicare Other

## 2021-10-20 DIAGNOSIS — G309 Alzheimer's disease, unspecified: Secondary | ICD-10-CM | POA: Diagnosis not present

## 2021-10-20 DIAGNOSIS — I639 Cerebral infarction, unspecified: Secondary | ICD-10-CM | POA: Diagnosis not present

## 2021-10-20 DIAGNOSIS — E663 Overweight: Secondary | ICD-10-CM | POA: Diagnosis present

## 2021-10-20 DIAGNOSIS — I10 Essential (primary) hypertension: Secondary | ICD-10-CM

## 2021-10-20 DIAGNOSIS — I5042 Chronic combined systolic (congestive) and diastolic (congestive) heart failure: Secondary | ICD-10-CM

## 2021-10-20 LAB — RESP PANEL BY RT-PCR (FLU A&B, COVID) ARPGX2
Influenza A by PCR: NEGATIVE
Influenza B by PCR: NEGATIVE
SARS Coronavirus 2 by RT PCR: NEGATIVE

## 2021-10-20 LAB — BRAIN NATRIURETIC PEPTIDE: B Natriuretic Peptide: 155.6 pg/mL — ABNORMAL HIGH (ref 0.0–100.0)

## 2021-10-20 MED ORDER — TUBERCULIN PPD 5 UNIT/0.1ML ID SOLN
5.0000 [IU] | Freq: Once | INTRADERMAL | Status: DC
  Administered 2021-10-20: 5 [IU] via INTRADERMAL
  Filled 2021-10-20: qty 0.1

## 2021-10-20 NOTE — Assessment & Plan Note (Addendum)
Echocardiogram during work-up reveals ejection fraction 35 to 40% as well as grade 1 diastolic dysfunction.  Given patient's advanced age and dementia, invasive cardiac cath would not be appropriate.  We will medically manage.  BNP at 155, patient euvolemic. ?

## 2021-10-20 NOTE — Plan of Care (Signed)
Consult noted for Harry Vargas. Notes and labs reviewed. Goals are set as discharge to rehab has been arranged for tomorrow. Case discussed with attending team, and as goals are established, will cancel consult.  Recommend outpatient palliative to follow.  ?

## 2021-10-20 NOTE — Assessment & Plan Note (Signed)
Meets criteria BMI greater than 25 

## 2021-10-20 NOTE — Progress Notes (Signed)
[facility] Progress Note ? ?Patient: Harry Vargas    GYJ:[id number]  DOA: 10/16/2021    ?Date of Service: the patient was seen and examined on 10/20/2021 ? ?Brief hospital course: ?Harry Vargas is a 88 y.o. male with medical history significant of HTN, CAD, pacemaker, Alzheimer's dementia who presented to the emergency room on 3/18 with fall and right sided facial droop.  Although CT scan noted no evidence of stroke, work-up noted 70% stenosis of the proximal left ICA as well as severe bilateral vertebral artery stenosis.  Patient unable to get MRI due to pacemaker and neurology felt that patient clinically had an acute CVA.  Recommendation was for aspirin and Plavix indefinitely.  Patient seen by PT and OT and plan is for skilled nursing facility, and patient will go on 3/23. ? ?Assessment and Plan: ?Assessment and Plan: ?* Falls ?--possible related to stroke or general decline ?--PT/OT and SNF rehab ? ? ? ?Facial droop due to acute stroke [facility]) ?# Acute ischemic stroke ?--dx by Neuro based on exam.  MRI brain was not obtained due to PM. ?--CTA head/neck showed critical R ICA stenosis, which was known to daughter. ?--pt was already taking both ASA and plavix PTA. ?Plan: ?--cont statin ?--cont ASA and plavix ?--monitor on tele for Afib ?- Amb referral to neurology upon discharge ? ?Chronic combined systolic (congestive) and diastolic (congestive) heart failure (HCC) ?Echocardiogram during work-up reveals ejection fraction 35 to 40% as well as grade 1 diastolic dysfunction.  Given patient's advanced age and dementia, invasive cardiac cath would not be appropriate.  We will medically manage.  BNP at 155, patient euvolemic. ? ?Benign essential hypertension ?Initially allowed for some permissive hypertension secondary to presumed CVA.  Blood pressures have now been stable. ?Prn hydralazine to SBP 220 and above.  ? ? ? ?3-vessel CAD ?Cont DAPT. ?Cont statin therapy.  ? ? ?Alzheimer's dementia with behavioral  disturbance ([place]) ?Patient has advanced dementia, most recently with behavioral disturbance starting about one week ago for which risperdal was started by outpatient neurologist for hallucinations. ?--Risperdal substituted with seroquel by neuro, however, pt became very somnolent, so seroquel d/c'ed ?Plan: ?--hold home risperdal ?--cont home donepezil ? ?Atrial flutter ([place]) ?Pt in sinus rhythm and rate controlled. ? ?Malnutrition of moderate degree ?Nutrition Status: ?Nutrition Problem: Moderate Malnutrition ?Etiology: social / environmental circumstances (dementia, advanced age) ?Signs/Symptoms: moderate fat depletion, moderate muscle depletion ?  ?Seen by nutrition.  Put on Magic cup 3 times daily, multivitamin and Ensure supplement 3 times daily. ? ? ? ?Neck pain ?Whole-body pain ?pt complained of neck pain and didn't want to his neck.  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  Also could be muscular strain due to lying in same position for prolonged period of time. ?--Norco PRN for pain, otherwise pt refuses to move ? ?Overweight (BMI 25.0-29.9) ?Meets criteria BMI greater than 25 ? ?Delirium ?--supportive care ? ?Somnolence ?--pt was staying up all night PTA, however, has been very somnolent since presentation, possibly due to seroquel (ordered new in place of home resperidone) and gabapentin.  Seroquel and home gabapentin d/c'ed ?Plan: ?--avoid sedating meds ? ? ? ? ? ? ?Body mass index is 26.91 kg/m?Marland Kitchen  ?Nutrition Problem: Moderate Malnutrition ?Etiology: social / environmental circumstances (dementia, advanced age) ?   ? ?Consultants: ?Nutrition ?Neurology ? ?Procedures: ?Echocardiogram: Ejection fraction 35 to 40% plus grade 1 diastolic dysfunction ? ?Antimicrobials: ?None ? ?Code Status: DNR ? ? ?Subjective: Patient doing okay,  no complaints ? ?Objective: ?Mildly elevated blood pressure ?Vitals:  ? 10/20/21 0758 10/20/21 1142  ?BP: (!) 147/85 (!) 163/79   ?Pulse: 67 77  ?Resp: 16 20  ?Temp: 99 ?F (37.2 ?C) 98.8 ?F (37.1 ?C)  ?SpO2: 94% 96%  ? ? ?Intake/Output Summary (Last 24 hours) at 10/20/2021 1437 ?Last data filed at 10/19/2021 1700 ?Gross per 24 hour  ?Intake 240 ml  ?Output --  ?Net 240 ml  ? ?Filed Weights  ? 10/16/21 1412 10/17/21 1734  ?Weight: 75 kg 71.1 kg  ? ?Body mass index is 26.91 kg/m?. ? ?Exam: ? ?General: Alert and oriented x2, no acute distress ?HEENT: Normocephalic and atraumatic, mucous membranes are slightly dry ?Cardiovascular: Regular rate and rhythm, S1-S2 ?Respiratory: Clear to auscultation bilaterally ?Abdomen: Soft, nontender, nondistended, positive bowel sounds ?Musculoskeletal: No clubbing or cyanosis, trace pitting edema ?Skin: No skin breaks, tears or lesions ?Psychiatry: Mild underlying dementia, otherwise consider acute psychosis ?Neurology: Minimal facial droop ? ?Data Reviewed: ?Minimally elevated BNP at 155 ? ?Disposition:  ?Status is: Inpatient ?Remains inpatient appropriate because: Plans for skilled nursing on 3/23 ?  ? ?Anticipated discharge date: 3/23 ? ?Remaining issues to be resolved so that patient can be discharged: COVID test prior to skilled nursing facility.  Test has been ordered ? ? ?Family Communication: Daughter at the bedside ?DVT Prophylaxis: ?enoxaparin (LOVENOX) injection 40 mg Start: 10/17/21 1600 ?SCDs Start: 10/16/21 1833 ? ? ? ?Author: ?Annita Brod ,MD ?10/20/2021 2:37 PM ? ?To reach On-call, see care teams to locate the attending and reach out via [email or web address]. ?Between 7PM-7AM, please contact night-coverage ?If you still have difficulty reaching the attending provider, please page the [facility] (Director on Call) for [facility] on amion for assistance. ? ?

## 2021-10-20 NOTE — TOC Progression Note (Signed)
Transition of Care (TOC) - Progression Note  ? ? ?Patient Details  ?Name: MARKEITH JUE ?MRN: [id number] ?Date of Birth: Sep 29, 1928 ? ?Transition of Care (TOC) CM/SW Contact  ?Pete Pelt, RN ?Phone Number: ?10/20/2021, 4:00 PM ? ?Clinical Narrative:   RNCM spoke to [place] at [facility], patient is able to transfer to facility tomorrow.  RNCM spoke to daughter, she is agreeable to transfer to [facility].  TOC to follow to discharge. ? ? ? ?  ?  ? ?Expected Discharge Plan and Services ?  ?  ?  ?  ?  ?                ?  ?  ?  ?  ?  ?  ?  ?  ?  ?  ? ? ?Social Determinants of Health (SDOH) Interventions ?  ? ?Readmission Risk Interventions ?   ? View : No data to display.  ?  ?  ?  ? ? ?

## 2021-10-20 NOTE — Progress Notes (Signed)
Physical Therapy Treatment ?Patient Details ?Name: Harry Vargas ?MRN: [id number] ?DOB: [birth year] ?Today's Date: 10/20/2021 ? ? ?History of Present Illness Pt is a 88 y/o M admitted on 10/16/21 after presenting with c/o fall & R sided facial droop. Pt noted to have critical R ICA stenosis. PMH: HTN, CAD, pacemaker, arthritis, Alzheimer's dementia, GERD, HLD, HTN, NSVT, prostate CA, sinus node dysfunction ? ?  ?PT Comments  ? ? Pt was long sitting in bed upon arriving. He is alert but HOH. His hearing makes assessment of cognition difficult. Overall, pt was able to follow simple commands consistently with increased time to process. He required min assist to exit bed. Stood to RW 2 x EOB prior to ambulating ~ 50 ft. No knee buckling present however per RN staff/pt's daughter, he was struggling to ambulate earlier in the day. Author recommends DC to SNF to address deficits while improving safety/ independence  with ADLs .  ?  ?Recommendations for follow up therapy are one component of a multi-disciplinary discharge planning process, led by the attending physician.  Recommendations may be updated based on patient status, additional functional criteria and insurance authorization. ? ?Follow Up Recommendations ? Skilled nursing-short term rehab (<3 hours/day) ?  ?  ?Assistance Recommended at Discharge Frequent or constant Supervision/Assistance  ?Patient can return home with the following A little help with walking and/or transfers;A little help with bathing/dressing/bathroom;Assistance with cooking/housework;Assistance with feeding;Direct supervision/assist for medications management;Direct supervision/assist for financial management;Assist for transportation;Help with stairs or ramp for entrance ?  ?Equipment Recommendations ? None recommended by PT  ?  ?   ?Precautions / Restrictions Precautions ?Precautions: Fall ?Restrictions ?Weight Bearing Restrictions: No  ?  ? ?Mobility ? Bed Mobility ?Overal bed mobility: Needs  Assistance ?Bed Mobility: Supine to Sit, Sit to Supine ?  ?  ?Supine to sit: Min assist, HOB elevated ?Sit to supine: Mod assist, HOB elevated ?  ?  ?  ? ?Transfers ?Overall transfer level: Needs assistance ?Equipment used: Rolling walker (2 wheels) ?Transfers: Sit to/from Stand ?Sit to Stand: Min assist ?  ?  ?  ?  ?  ?General transfer comment: min assist for safety however 2nd attempt he stood CGA. Concerns for knee buckling but none observed during this session ?  ? ?Ambulation/Gait ?Ambulation/Gait assistance: Min guard ?Gait Distance (Feet): 50 Feet ?Assistive device: Rolling walker (2 wheels) ?Gait Pattern/deviations: Step-through pattern ?Gait velocity: decreased ?  ?  ?General Gait Details: Pt ambulated with CGA for safety with vcs for posture correction ? ? ?  ?Balance Overall balance assessment: Needs assistance ?Sitting-balance support: Feet supported, Bilateral upper extremity supported ?Sitting balance-Leahy Scale: Good ?  ?  ?Standing balance support: Bilateral upper extremity supported, During functional activity ?Standing balance-Leahy Scale: Fair ?Standing balance comment: reliant on RW for balance ?  ?  ?  ?Cognition Arousal/Alertness: Awake/alert ?Behavior During Therapy: [facility] for tasks assessed/performed (HOH) ?Overall Cognitive Status: History of cognitive impairments - at baseline ?  ?   ?General Comments: pt is A but HOH. hard to assess try cognition due to pt's hearing. ?  ?  ? ?  ?   ?   ? ?Pertinent Vitals/Pain Pain Assessment ?Pain Assessment: No/denies pain ?Faces Pain Scale: No hurt  ? ? ? ?PT Goals (current goals can now be found in the care plan section) Acute Rehab PT Goals ?Patient Stated Goal: to get better ?Progress towards PT goals: Progressing toward goals ? ?  ?Frequency ? ? ? Min 2X/week ? ? ? ?  ?  PT Plan Current plan remains appropriate  ? ? ?Co-evaluation   ?  ?PT goals addressed during session: Mobility/safety with mobility;Balance;Proper use of DME;Strengthening/ROM ?  ?   ? ?  ?AM-PAC PT "6 Clicks" Mobility   ?Outcome Measure ? Help needed turning from your back to your side while in a flat bed without using bedrails?: A Little ?Help needed moving from lying on your back to sitting on the side of a flat bed without using bedrails?: A Little ?Help needed moving to and from a bed to a chair (including a wheelchair)?: A Little ?Help needed standing up from a chair using your arms (e.g., wheelchair or bedside chair)?: A Little ?Help needed to walk in hospital room?: A Little ?Help needed climbing 3-5 steps with a railing? : A Lot ?6 Click Score: 17 ? ?  ?End of Session   ?Activity Tolerance: Patient tolerated treatment well ?Patient left: in bed;with call bell/phone within reach;with family/visitor present ?Nurse Communication: Mobility status ?PT Visit Diagnosis: Unsteadiness on feet (R26.81);Muscle weakness (generalized) (M62.81);Difficulty in walking, not elsewhere classified (R26.2);Other abnormalities of gait and mobility (R26.89) ?  ? ? ?Time: 1450-1505 ?PT Time Calculation (min) (ACUTE ONLY): 15 min ? ?Charges:  $Gait Training: 8-22 mins          ?Julaine Fusi PTA ?10/20/21, 3:32 PM  ? ?

## 2021-10-20 NOTE — TOC Progression Note (Signed)
Transition of Care (TOC) - Progression Note  ? ? ?Patient Details  ?Name: Harry Vargas ?MRN: [id number] ?Date of Birth: [birth year] ? ?Transition of Care (TOC) CM/SW Contact  ?Pete Pelt, RN ?Phone Number: ?10/20/2021, 2:18 PM ? ?Clinical Narrative:   As per Rickey, patient can transfer to [facility] tomorrow.  MD made aware that patient will need COVID test prior to discharge.  TOC to follow. ? ? ? ?  ?  ? ?Expected Discharge Plan and Services ?  ?  ?  ?  ?  ?                ?  ?  ?  ?  ?  ?  ?  ?  ?  ?  ? ? ?Social Determinants of Health (SDOH) Interventions ?  ? ?Readmission Risk Interventions ?   ? View : No data to display.  ?  ?  ?  ? ? ?

## 2021-10-21 DIAGNOSIS — G309 Alzheimer's disease, unspecified: Secondary | ICD-10-CM | POA: Diagnosis not present

## 2021-10-21 DIAGNOSIS — R1310 Dysphagia, unspecified: Secondary | ICD-10-CM

## 2021-10-21 DIAGNOSIS — I639 Cerebral infarction, unspecified: Secondary | ICD-10-CM | POA: Diagnosis not present

## 2021-10-21 DIAGNOSIS — E440 Moderate protein-calorie malnutrition: Secondary | ICD-10-CM | POA: Diagnosis not present

## 2021-10-21 DIAGNOSIS — I5042 Chronic combined systolic (congestive) and diastolic (congestive) heart failure: Secondary | ICD-10-CM | POA: Diagnosis not present

## 2021-10-21 DIAGNOSIS — E663 Overweight: Secondary | ICD-10-CM

## 2021-10-21 MED ORDER — ADULT MULTIVITAMIN W/MINERALS CH
1.0000 | ORAL_TABLET | Freq: Every day | ORAL | Status: DC
Start: 2021-10-22 — End: 2021-12-10

## 2021-10-21 MED ORDER — ENSURE MAX PROTEIN PO LIQD: 11.0000 [oz_av] | Freq: Two times a day (BID) | ORAL | Status: DC

## 2021-10-21 MED ORDER — CLOPIDOGREL BISULFATE 75 MG PO TABS: 75.0000 mg | ORAL_TABLET | Freq: Every day | ORAL | 1 refills | Status: DC

## 2021-10-21 NOTE — Progress Notes (Signed)
?   10/19/21 0219  ?Assess: MEWS Score  ?Temp 98.5 ?F (36.9 ?C)  ?BP 120/80  ?Pulse Rate (!) 134  ?Resp 16  ?SpO2 94 %  ?O2 Device Nasal Cannula  ?O2 Flow Rate (L/min) 2 L/min  ?Assess: MEWS Score  ?MEWS Temp 0  ?MEWS Systolic 0  ?MEWS Pulse 3  ?MEWS RR 0  ?MEWS LOC 0  ?MEWS Score 3  ?MEWS Score Color Yellow  ?Assess: if the MEWS score is Yellow or Red  ?Were vital signs taken at a resting state? Yes  ?Focused Assessment No change from prior assessment  ?Does the patient meet 2 or more of the SIRS criteria? No  ?MEWS guidelines implemented *See Row Information* No, vital signs rechecked  ?Treat  ?MEWS Interventions Escalated (See documentation below)  ?Escalate  ?MEWS: Escalate Yellow: discuss with charge nurse/RN and consider discussing with provider and RRT  ?Notify: Charge Nurse/RN  ?Name of Charge Nurse/RN Notified Stanton Kidney, RN  ?Date Charge Nurse/RN Notified 10/19/21  ?Time Charge Nurse/RN Notified 0225  ?Notify: Provider  ?Provider Name/Title Otilio Miu, NP  ?Date Provider Notified 10/19/21  ?Time Provider Notified 0222  ?Notification Type  ?(secure chat)  ?Notification Reason Change in status ?(ST 130s)  ?Provider response No new orders  ?Date of Provider Response 10/19/21  ?Document  ?Patient Outcome Stabilized after interventions  ?Progress note created (see row info) Yes  ?Assess: SIRS CRITERIA  ?SIRS Temperature  0  ?SIRS Pulse 1  ?SIRS Respirations  0  ?SIRS WBC 0  ?SIRS Score Sum  1  ? ? ?

## 2021-10-21 NOTE — Progress Notes (Signed)
CCMD notified this RN that patient had 7bts VT then went back into A paced rhythm with PVCs. Pt. Asymptomatic. Neomia Glass, NP made aware.  ?

## 2021-10-21 NOTE — Progress Notes (Signed)
Physical Therapy Treatment ?Patient Details ?Name: Harry Vargas ?MRN: [id number] ?DOB: [birth year] ?Today's Date: 10/21/2021 ? ? ?History of Present Illness Pt is a 88 y/o M admitted on 10/16/21 after presenting with c/o fall & R sided facial droop. Pt noted to have critical R ICA stenosis. PMH: HTN, CAD, pacemaker, arthritis, Alzheimer's dementia, GERD, HLD, HTN, NSVT, prostate CA, sinus node dysfunction ? ?  ?PT Comments  ? ? Pt was supine in bed upon arriving. Is awake however only oriented x 2. He continues to be severely limited by cognition/HOH. Needed more assistance to achieve EOB sitting but once sitting was able to stand to RW and ambulate ~ 100 ft with mostly CGA but min assist for turns. Poor posture during ambulation. Overall pt is progressing. Recommend DC to SNF to address deficits while maximizing independence with ADLs.  ? ?  ?`Recommendations for follow up therapy are one component of a multi-disciplinary discharge planning process, led by the attending physician.  Recommendations may be updated based on patient status, additional functional criteria and insurance authorization. ? ?Follow Up Recommendations ? Skilled nursing-short term rehab (<3 hours/day) ?  ?  ?Assistance Recommended at Discharge Frequent or constant Supervision/Assistance  ?Patient can return home with the following A little help with walking and/or transfers;A little help with bathing/dressing/bathroom;Assistance with cooking/housework;Assistance with feeding;Direct supervision/assist for medications management;Direct supervision/assist for financial management;Assist for transportation;Help with stairs or ramp for entrance ?  ?Equipment Recommendations ? None recommended by PT  ?  ?   ?Precautions / Restrictions Precautions ?Precautions: Fall ?Restrictions ?Weight Bearing Restrictions: No  ?  ? ?Mobility ? Bed Mobility ?Overal bed mobility: Needs Assistance ?Bed Mobility: Supine to Sit, Sit to Supine ?  ?  ?Supine to sit: Max  assist (poor abilitiy to inititae movements today.) ?Sit to supine: Supervision ?  ?General bed mobility comments: Pt required more assistance to exit bed today due to cognition more so than physical abilities. pt is extremely HOH and author questions if he knew what was being requested of him. ?  ? ?Transfers ?Overall transfer level: Needs assistance ?Equipment used: Rolling walker (2 wheels) ?Transfers: Sit to/from Stand ?Sit to Stand: Min assist ?  ?  ?Ambulation/Gait ?Ambulation/Gait assistance: Min guard, Min assist ?Gait Distance (Feet): 100 Feet ?Assistive device: Rolling walker (2 wheels) ?Gait Pattern/deviations: Step-through pattern, Trunk flexed ?Gait velocity: decreased ?  ?  ?General Gait Details: pt ambulated a total 100 ft with RW with mostly CGA but does require min assist with turns. ? ?  ?Balance Overall balance assessment: Needs assistance ?Sitting-balance support: Feet supported, Bilateral upper extremity supported ?Sitting balance-Leahy Scale: Good ?  ?  ?Standing balance support: Bilateral upper extremity supported, During functional activity ?Standing balance-Leahy Scale: Fair ?  ?   ?Cognition Arousal/Alertness: Awake/alert ?Behavior During Therapy: [facility] for tasks assessed/performed ?Overall Cognitive Status: History of cognitive impairments - at baseline ?  ?   ?General Comments: Pt is alert and oriented x 2. Extremely HOH which greatly impacts session progression. ?  ?  ? ?  ?   ?   ? ?Pertinent Vitals/Pain Pain Assessment ?Pain Assessment: No/denies pain  ? ? ? ?PT Goals (current goals can now be found in the care plan section) Acute Rehab PT Goals ?Patient Stated Goal: none stated ?Progress towards PT goals: Progressing toward goals ? ?  ?Frequency ? ? ? Min 2X/week ? ? ? ?  ?PT Plan Current plan remains appropriate  ? ? ?Co-evaluation   ?  ?PT goals addressed  during session: Mobility/safety with mobility;Balance;Proper use of DME;Strengthening/ROM ?  ?  ? ?  ?AM-PAC PT "6 Clicks" Mobility    ?Outcome Measure ? Help needed turning from your back to your side while in a flat bed without using bedrails?: A Little ?Help needed moving from lying on your back to sitting on the side of a flat bed without using bedrails?: A Little ?Help needed moving to and from a bed to a chair (including a wheelchair)?: A Little ?Help needed standing up from a chair using your arms (e.g., wheelchair or bedside chair)?: A Little ?Help needed to walk in hospital room?: A Little ?Help needed climbing 3-5 steps with a railing? : A Little ?6 Click Score: 18 ? ?  ?End of Session Equipment Utilized During Treatment: Gait belt;Oxygen ?Activity Tolerance: Patient tolerated treatment well ?Patient left: in bed;with call bell/phone within reach;with family/visitor present ?Nurse Communication: Mobility status ?PT Visit Diagnosis: Unsteadiness on feet (R26.81);Muscle weakness (generalized) (M62.81);Difficulty in walking, not elsewhere classified (R26.2);Other abnormalities of gait and mobility (R26.89) ?  ? ? ?Time: 1131-1145 ?PT Time Calculation (min) (ACUTE ONLY): 14 min ? ?Charges:  $Gait Training: 8-22 mins          ?          ?Julaine Fusi PTA ?10/21/21, 12:18 PM  ? ?

## 2021-10-21 NOTE — Progress Notes (Signed)
Report called to Gregary Signs, LPN at [facility]. EMS transport arranged. ?

## 2021-10-21 NOTE — Assessment & Plan Note (Signed)
Secondary to CVA.  On dysphagia 1 diet with thin liquids.  Speech therapy will follow patient at skilled nursing. ?

## 2021-10-21 NOTE — TOC Progression Note (Signed)
Transition of Care (TOC) - Progression Note  ? ? ?Patient Details  ?Name: Harry Vargas ?MRN: [id number] ?Date of Birth: 12-21-1928 ? ?Transition of Care (TOC) CM/SW Contact  ?Pete Pelt, RN ?Phone Number: ?10/21/2021, 2:11 PM ? ?Clinical Narrative:  Patient can transfer to [facility] today as per [facility] at [facility].  EMS contacted and daughter aware of transfer.  ? ? ? ?  ?  ? ?Expected Discharge Plan and Services ?  ?  ?  ?  ?  ?Expected Discharge Date: 10/21/21               ?  ?  ?  ?  ?  ?  ?  ?  ?  ?  ? ? ?Social Determinants of Health (SDOH) Interventions ?  ? ?Readmission Risk Interventions ?   ? View : No data to display.  ?  ?  ?  ? ? ?

## 2021-10-21 NOTE — Care Management Important Message (Signed)
Important Message ? ?Patient Details  ?Name: Harry Vargas ?MRN: [id number] ?Date of Birth: Apr 26, 1929 ? ? ?Medicare Important Message Given:  Yes ? ?I reviewed the Important Message from Medicare with his daughter, [name] at [phone] and have sent a copy via secure e-mail to [email or web address] as directed.  I thanked her for her time. ? ? ?[name] ?10/21/2021, 10:26 AM ?

## 2021-10-21 NOTE — Discharge Summary (Signed)
?Physician Discharge Summary ?  ?Patient: Harry Vargas MRN: [id number] DOB: Jul 06, 1929  ?Admit date:     10/16/2021  ?Discharge date: 10/21/21  ?Discharge Physician: Annita Brod  ? ?PCP: Rusty Aus, MD  ? ?Recommendations at discharge:  ? ?New medication: Aspirin 81 mg p.o. daily ?New medication: Plavix 75 mg p.o. daily ?Medication change: Neurontin, Ativan, Klonopin, Risperdal discontinued ?New medication: Multivitamin p.o. daily ? ?Discharge Diagnoses: ?Principal Problem: ?  Falls ?Active Problems: ?  Facial droop due to acute stroke [facility]) ?  Chronic combined systolic (congestive) and diastolic (congestive) heart failure (HCC) ?  3-vessel CAD ?  Benign essential hypertension ?  Alzheimer's dementia with behavioral disturbance ([place]) ?  Dysphagia ?  Malnutrition of moderate degree ?  Neck pain ?  Overweight (BMI 25.0-29.9) ?  Somnolence ?  Delirium ? ?Resolved Problems: ?  * No resolved hospital problems. * ? ?Hospital Course: ?Harry Vargas is a 88 y.o. male with medical history significant of HTN, CAD, pacemaker, Alzheimer's dementia who presented to the emergency room on 3/18 with fall and right sided facial droop.  Although CT scan noted no evidence of stroke, work-up noted 70% stenosis of the proximal left ICA as well as severe bilateral vertebral artery stenosis.  Patient unable to get MRI due to pacemaker and neurology felt that patient clinically had an acute CVA.  Recommendation was for aspirin and Plavix indefinitely.  Patient seen by PT and OT and plan is for skilled nursing facility.  Patient stable and discharged on 3/23. ? ?Assessment and Plan: ?* Falls ?--possible related to stroke or general decline ?--PT/OT and SNF rehab ? ? ? ?Facial droop due to acute stroke [name]) ?# Acute ischemic stroke ?--dx by Neuro based on exam.  MRI brain was not obtained due to PM. ?--CTA head/neck showed critical R ICA stenosis, which was known to daughter. ?--pt was already taking both ASA and plavix  PTA. ?Plan: ?--cont statin ?--cont ASA and plavix ?--monitor on tele for Afib ?- Amb referral to neurology upon discharge ? ?Chronic combined systolic (congestive) and diastolic (congestive) heart failure (HCC) ?Echocardiogram during work-up reveals ejection fraction 35 to 40% as well as grade 1 diastolic dysfunction.  Given patient's advanced age and dementia, invasive cardiac cath would not be appropriate.  We will medically manage.  BNP at 155, patient euvolemic. ? ?Benign essential hypertension ?Initially allowed for some permissive hypertension secondary to presumed CVA.  Blood pressures have now been stable. ?Prn hydralazine to SBP 220 and above.  ? ? ? ?3-vessel CAD ?Cont DAPT. ?Cont statin therapy.  ? ? ?Dysphagia ?Secondary to CVA.  On dysphagia 1 diet with thin liquids.  Speech therapy will follow patient at skilled nursing. ? ?Alzheimer's dementia with behavioral disturbance ([place]) ?Patient has advanced dementia, most recently with behavioral disturbance starting about one week ago for which risperdal was started by outpatient neurologist for hallucinations. ?--Risperdal substituted with seroquel by neuro, however, pt became very somnolent, so seroquel d/c'ed ?Plan: ?--hold home risperdal ?--cont home donepezil ? ?Atrial flutter ([place]) ?Pt in sinus rhythm and rate controlled. ? ?Malnutrition of moderate degree ?Nutrition Status: ?Nutrition Problem: Moderate Malnutrition ?Etiology: social / environmental circumstances (dementia, advanced age) ?Signs/Symptoms: moderate fat depletion, moderate muscle depletion ?  ?Seen by nutrition.  Put on Magic cup 3 times daily, multivitamin and Ensure supplement 3 times daily. ? ? ? ?Neck pain ?Whole-body pain ?pt complained of neck pain and didn't want to his neck.  CTA head/neck mentioned Severe degenerative disc  disease in the lower cervical ?Spine.  Also could be muscular strain due to lying in same position for prolonged period of time.  Seen by physical therapy and  will get PT and OT at skilled nursing. ? ?Overweight (BMI 25.0-29.9) ?Meets criteria BMI greater than 25 ? ?Delirium ?--supportive care ? ?Somnolence ?--pt was staying up all night PTA, however, has been very somnolent since presentation, possibly due to seroquel (ordered new in place of home resperidone) and gabapentin.  Seroquel and home gabapentin d/c'ed ?Plan: ?--avoid sedating meds ? ? ? ? ?  ? ? ?Consultants: Palliative care, neurology, nutrition ?Procedures performed: Echocardiogram: Ejection fraction of 35 to 40% and grade 1 diastolic dysfunction ?Disposition: Skilled nursing facility ?Diet recommendation:  ?Discharge Diet Orders (From admission, onward)  ? ?  Start     Ordered  ? 10/21/21 0000  DIET - DYS 1       ?Question:  Fluid consistency:  Answer:  Thin  ? 10/21/21 1325  ? ?  ?  ? ?  ? ?Dysphagia type 1 Thin Liquid ?DISCHARGE MEDICATION: ?Allergies as of 10/21/2021   ?No Known Allergies ?  ? ?  ?Medication List  ?  ? ?STOP taking these medications   ? ?clonazePAM 1 MG tablet ?Commonly known as: KLONOPIN ?  ?gabapentin 100 MG capsule ?Commonly known as: NEURONTIN ?  ?LORazepam 1 MG tablet ?Commonly known as: Ativan ?  ?risperiDONE 0.25 MG tablet ?Commonly known as: RISPERDAL ?  ? ?  ? ?TAKE these medications   ? ?acetaminophen 650 MG CR tablet ?Commonly known as: TYLENOL ?Take 1,300 mg by mouth every 8 (eight) hours as needed. ?  ?Apple Cider Vinegar 600 MG Caps ?Take 600 mg by mouth 3 (three) times daily. ?  ?aspirin EC 81 MG tablet ?Take 81 mg by mouth daily. ?  ?Calcium 600-200 MG-UNIT tablet ?Take 1 tablet by mouth 2 (two) times daily with a meal. ?  ?clopidogrel 75 MG tablet ?Commonly known as: PLAVIX ?Take 1 tablet (75 mg total) by mouth daily. ?Start taking on: October 22, 2021 ?  ?donepezil 5 MG tablet ?Commonly known as: ARICEPT ?Take 5 mg by mouth at bedtime. ?  ?Ensure Max Protein Liqd ?Take 330 mLs (11 oz total) by mouth 2 (two) times daily. ?  ?furosemide 20 MG tablet ?Commonly known as:  LASIX ?Take 20 mg by mouth daily. ?  ?ipratropium 0.06 % nasal spray ?Commonly known as: ATROVENT ?Place 1 spray into both nostrils 2 (two) times daily. ?  ?Leuprolide Acetate (6 Month) 45 MG injection ?Commonly known as: LUPRON ?Inject 45 mg into the muscle every 6 (six) months. ?  ?memantine 10 MG tablet ?Commonly known as: NAMENDA ?Take 10 mg by mouth 2 (two) times daily. ?  ?multivitamin with minerals Tabs tablet ?Take 1 tablet by mouth daily. ?Start taking on: October 22, 2021 ?  ?polyethylene glycol powder 17 GM/SCOOP powder ?Commonly known as: GLYCOLAX/MIRALAX ?Take 17 g by mouth daily. ?  ?pravastatin 20 MG tablet ?Commonly known as: PRAVACHOL ?Take 20 mg by mouth every evening. ?  ?vitamin B-12 1000 MCG tablet ?Commonly known as: CYANOCOBALAMIN ?Take 1,000 mcg by mouth daily. ?  ? ?  ? ? Contact information for after-discharge care   ? ? Destination   ? ? [facility] .   ?Service: Skilled Nursing ?Contact information: ?[address] ?[place] ?[phone] ? ?  ?  ? ?  ?  ? ?  ?  ? ?  ? ?  Discharge Exam: ?Filed Weights  ? 10/16/21 1412 10/17/21 1734  ?Weight: 75 kg 71.1 kg  ? ?General: Oriented x1, no acute distress ?Cardiovascular: Regular rate and rhythm, S1-S2 ?Lungs: Clear to auscultation bilaterally ? ?Condition at discharge: good ? ?The results of significant diagnostics from this hospitalization (including imaging, microbiology, ancillary and laboratory) are listed below for reference.  ? ?Imaging Studies: ?CT ANGIO HEAD NECK W WO CM ? ?Result Date: 10/16/2021 ?CLINICAL DATA:  Neuro deficit, acute, stroke suspected EXAM: CT ANGIOGRAPHY HEAD AND NECK TECHNIQUE: Multidetector CT imaging of the head and neck was performed using the standard protocol during bolus administration of intravenous contrast. Multiplanar CT image reconstructions and MIPs were obtained to evaluate the vascular anatomy. Carotid stenosis measurements (when applicable) are obtained  utilizing NASCET criteria, using the distal internal carotid diameter as the denominator. RADIATION DOSE REDUCTION: This exam was performed according to the departmental dose-optimization program which includes autom

## 2021-12-06 ENCOUNTER — Inpatient Hospital Stay: Payer: Medicare Other

## 2021-12-06 ENCOUNTER — Inpatient Hospital Stay
Admission: EM | Admit: 2021-12-06 | Discharge: 2021-12-10 | DRG: 189 | Disposition: A | Discharge status: Skilled Nursing Facility | Payer: Medicare Other | Source: Skilled Nursing Facility | Attending: Hospitalist | Admitting: Hospitalist

## 2021-12-06 ENCOUNTER — Emergency Department: Payer: Medicare Other

## 2021-12-06 ENCOUNTER — Observation Stay: Payer: Medicare Other

## 2021-12-06 ENCOUNTER — Inpatient Hospital Stay (HOSPITAL_COMMUNITY)
Admit: 2021-12-06 | Discharge: 2021-12-06 | Disposition: A | Discharge status: Home / Self Care | Payer: Medicare Other | Attending: Internal Medicine | Admitting: Internal Medicine

## 2021-12-06 ENCOUNTER — Other Ambulatory Visit: Payer: Self-pay

## 2021-12-06 DIAGNOSIS — Z20822 Contact with and (suspected) exposure to covid-19: Secondary | ICD-10-CM | POA: Diagnosis present

## 2021-12-06 DIAGNOSIS — W010XXA Fall on same level from slipping, tripping and stumbling without subsequent striking against object, initial encounter: Secondary | ICD-10-CM | POA: Diagnosis present

## 2021-12-06 DIAGNOSIS — Z515 Encounter for palliative care: Secondary | ICD-10-CM

## 2021-12-06 DIAGNOSIS — I5032 Chronic diastolic (congestive) heart failure: Secondary | ICD-10-CM | POA: Diagnosis present

## 2021-12-06 DIAGNOSIS — Z8546 Personal history of malignant neoplasm of prostate: Secondary | ICD-10-CM | POA: Diagnosis not present

## 2021-12-06 DIAGNOSIS — Z79818 Long term (current) use of other agents affecting estrogen receptors and estrogen levels: Secondary | ICD-10-CM

## 2021-12-06 DIAGNOSIS — E538 Deficiency of other specified B group vitamins: Secondary | ICD-10-CM | POA: Diagnosis present

## 2021-12-06 DIAGNOSIS — J9601 Acute respiratory failure with hypoxia: Principal | ICD-10-CM | POA: Diagnosis present

## 2021-12-06 DIAGNOSIS — I495 Sick sinus syndrome: Secondary | ICD-10-CM | POA: Diagnosis present

## 2021-12-06 DIAGNOSIS — I5031 Acute diastolic (congestive) heart failure: Secondary | ICD-10-CM

## 2021-12-06 DIAGNOSIS — I10 Essential (primary) hypertension: Secondary | ICD-10-CM | POA: Diagnosis not present

## 2021-12-06 DIAGNOSIS — K219 Gastro-esophageal reflux disease without esophagitis: Secondary | ICD-10-CM | POA: Diagnosis present

## 2021-12-06 DIAGNOSIS — Z7982 Long term (current) use of aspirin: Secondary | ICD-10-CM

## 2021-12-06 DIAGNOSIS — I959 Hypotension, unspecified: Secondary | ICD-10-CM | POA: Diagnosis present

## 2021-12-06 DIAGNOSIS — G9341 Metabolic encephalopathy: Secondary | ICD-10-CM | POA: Diagnosis present

## 2021-12-06 DIAGNOSIS — Z951 Presence of aortocoronary bypass graft: Secondary | ICD-10-CM

## 2021-12-06 DIAGNOSIS — S22000A Wedge compression fracture of unspecified thoracic vertebra, initial encounter for closed fracture: Secondary | ICD-10-CM | POA: Diagnosis not present

## 2021-12-06 DIAGNOSIS — I110 Hypertensive heart disease with heart failure: Secondary | ICD-10-CM | POA: Diagnosis present

## 2021-12-06 DIAGNOSIS — Z7902 Long term (current) use of antithrombotics/antiplatelets: Secondary | ICD-10-CM

## 2021-12-06 DIAGNOSIS — S060X0A Concussion without loss of consciousness, initial encounter: Secondary | ICD-10-CM | POA: Diagnosis present

## 2021-12-06 DIAGNOSIS — R402132 Coma scale, eyes open, to sound, at arrival to emergency department: Secondary | ICD-10-CM | POA: Diagnosis present

## 2021-12-06 DIAGNOSIS — C61 Malignant neoplasm of prostate: Secondary | ICD-10-CM | POA: Diagnosis present

## 2021-12-06 DIAGNOSIS — J9690 Respiratory failure, unspecified, unspecified whether with hypoxia or hypercapnia: Secondary | ICD-10-CM | POA: Diagnosis present

## 2021-12-06 DIAGNOSIS — I2510 Atherosclerotic heart disease of native coronary artery without angina pectoris: Secondary | ICD-10-CM | POA: Diagnosis present

## 2021-12-06 DIAGNOSIS — Z7189 Other specified counseling: Secondary | ICD-10-CM | POA: Diagnosis not present

## 2021-12-06 DIAGNOSIS — J9602 Acute respiratory failure with hypercapnia: Secondary | ICD-10-CM | POA: Diagnosis present

## 2021-12-06 DIAGNOSIS — R627 Adult failure to thrive: Secondary | ICD-10-CM | POA: Diagnosis not present

## 2021-12-06 DIAGNOSIS — R402362 Coma scale, best motor response, obeys commands, at arrival to emergency department: Secondary | ICD-10-CM | POA: Diagnosis present

## 2021-12-06 DIAGNOSIS — Z79899 Other long term (current) drug therapy: Secondary | ICD-10-CM

## 2021-12-06 DIAGNOSIS — I482 Chronic atrial fibrillation, unspecified: Secondary | ICD-10-CM | POA: Diagnosis present

## 2021-12-06 DIAGNOSIS — W19XXXA Unspecified fall, initial encounter: Secondary | ICD-10-CM | POA: Diagnosis not present

## 2021-12-06 DIAGNOSIS — Z66 Do not resuscitate: Secondary | ICD-10-CM | POA: Diagnosis present

## 2021-12-06 DIAGNOSIS — G309 Alzheimer's disease, unspecified: Secondary | ICD-10-CM | POA: Diagnosis present

## 2021-12-06 DIAGNOSIS — R0902 Hypoxemia: Secondary | ICD-10-CM

## 2021-12-06 DIAGNOSIS — F0280 Dementia in other diseases classified elsewhere without behavioral disturbance: Secondary | ICD-10-CM | POA: Diagnosis present

## 2021-12-06 DIAGNOSIS — S22080A Wedge compression fracture of T11-T12 vertebra, initial encounter for closed fracture: Secondary | ICD-10-CM

## 2021-12-06 DIAGNOSIS — R1310 Dysphagia, unspecified: Secondary | ICD-10-CM | POA: Diagnosis present

## 2021-12-06 DIAGNOSIS — M1990 Unspecified osteoarthritis, unspecified site: Secondary | ICD-10-CM | POA: Diagnosis present

## 2021-12-06 DIAGNOSIS — E782 Mixed hyperlipidemia: Secondary | ICD-10-CM | POA: Diagnosis present

## 2021-12-06 DIAGNOSIS — Z950 Presence of cardiac pacemaker: Secondary | ICD-10-CM | POA: Diagnosis not present

## 2021-12-06 DIAGNOSIS — R402242 Coma scale, best verbal response, confused conversation, at arrival to emergency department: Secondary | ICD-10-CM | POA: Diagnosis present

## 2021-12-06 DIAGNOSIS — M4854XA Collapsed vertebra, not elsewhere classified, thoracic region, initial encounter for fracture: Secondary | ICD-10-CM | POA: Diagnosis present

## 2021-12-06 LAB — BASIC METABOLIC PANEL
Anion gap: 9 (ref 5–15)
BUN: 19 mg/dL (ref 8–23)
CO2: 33 mmol/L — ABNORMAL HIGH (ref 22–32)
Calcium: 9 mg/dL (ref 8.9–10.3)
Chloride: 96 mmol/L — ABNORMAL LOW (ref 98–111)
Creatinine, Ser: 1 mg/dL (ref 0.61–1.24)
GFR, Estimated: >60 mL/min (ref >60)
Glucose, Bld: 93 mg/dL (ref 70–99)
Potassium: 4.3 mmol/L (ref 3.5–5.1)
Sodium: 138 mmol/L (ref 135–145)

## 2021-12-06 LAB — CBC WITH DIFFERENTIAL/PLATELET
Abs Immature Granulocytes: 0.16 10*3/uL — ABNORMAL HIGH (ref 0.00–0.07)
Basophils Absolute: 0.1 10*3/uL (ref 0.0–0.1)
Basophils Relative: 1 %
Eosinophils Absolute: 0.3 10*3/uL (ref 0.0–0.5)
Eosinophils Relative: 3 %
HCT: 52.7 % — ABNORMAL HIGH (ref 39.0–52.0)
Hemoglobin: 15.8 g/dL (ref 13.0–17.0)
Immature Granulocytes: 2 %
Lymphocytes Relative: 12 %
Lymphs Abs: 1.2 10*3/uL (ref 0.7–4.0)
MCH: 25.9 pg — ABNORMAL LOW (ref 26.0–34.0)
MCHC: 30 g/dL (ref 30.0–36.0)
MCV: 86.4 fL (ref 80.0–100.0)
Monocytes Absolute: 0.6 10*3/uL (ref 0.1–1.0)
Monocytes Relative: 6 %
Neutro Abs: 7.6 10*3/uL (ref 1.7–7.7)
Neutrophils Relative %: 76 %
Platelets: 171 10*3/uL (ref 150–400)
RBC: 6.1 MIL/uL — ABNORMAL HIGH (ref 4.22–5.81)
RDW: 16 % — ABNORMAL HIGH (ref 11.5–15.5)
WBC: 9.9 10*3/uL (ref 4.0–10.5)
nRBC: 0 % (ref 0.0–0.2)

## 2021-12-06 LAB — ECHOCARDIOGRAM COMPLETE
AR max vel: 1 cm2
AV Area VTI: 1.07 cm2
AV Area mean vel: 1.01 cm2
AV Mean grad: 7 mmHg
AV Peak grad: 12.8 mmHg
Ao pk vel: 1.79 m/s
Area-P 1/2: 1.63 cm2
Height: 64 in
MV VTI: 1.17 cm2
S' Lateral: 2.74 cm
Weight: 2504.43 oz

## 2021-12-06 LAB — BRAIN NATRIURETIC PEPTIDE: B Natriuretic Peptide: 121.3 pg/mL — ABNORMAL HIGH (ref 0.0–100.0)

## 2021-12-06 LAB — TROPONIN I (HIGH SENSITIVITY): Troponin I (High Sensitivity): 16 ng/L (ref <18)

## 2021-12-06 LAB — LIPASE, BLOOD: Lipase: 55 U/L — ABNORMAL HIGH (ref 11–51)

## 2021-12-06 MED ORDER — DM-GUAIFENESIN ER 30-600 MG PO TB12: 1.0000 | ORAL_TABLET | Freq: Two times a day (BID) | ORAL | Status: DC | PRN

## 2021-12-06 MED ORDER — MORPHINE SULFATE (PF) 2 MG/ML IV SOLN
2.0000 mg | Freq: Once | INTRAVENOUS | Status: AC
  Administered 2021-12-06: 2 mg via INTRAVENOUS
  Filled 2021-12-06: qty 1

## 2021-12-06 MED ORDER — ONDANSETRON HCL 4 MG/2ML IJ SOLN
4.0000 mg | Freq: Three times a day (TID) | INTRAMUSCULAR | Status: DC | PRN
Start: 2021-12-06 — End: 2021-12-10

## 2021-12-06 MED ORDER — IPRATROPIUM-ALBUTEROL 0.5-2.5 (3) MG/3ML IN SOLN
3.0000 mL | RESPIRATORY_TRACT | Status: DC
Start: 2021-12-06 — End: 2021-12-07
  Administered 2021-12-06 – 2021-12-07 (×6): 3 mL via RESPIRATORY_TRACT
  Filled 2021-12-06 (×6): qty 3

## 2021-12-06 MED ORDER — IOHEXOL 300 MG/ML  SOLN
100.0000 mL | Freq: Once | INTRAMUSCULAR | Status: AC | PRN
  Administered 2021-12-06: 100 mL via INTRAVENOUS

## 2021-12-06 MED ORDER — HYDROCORTISONE SOD SUC (PF) 100 MG IJ SOLR
100.0000 mg | Freq: Once | INTRAMUSCULAR | Status: DC
Start: 2021-12-06 — End: 2021-12-06
  Filled 2021-12-06: qty 2

## 2021-12-06 MED ORDER — SODIUM CHLORIDE 0.9 % IV BOLUS
250.0000 mL | Freq: Once | INTRAVENOUS | Status: AC
  Administered 2021-12-06: 250 mL via INTRAVENOUS

## 2021-12-06 MED ORDER — ADULT MULTIVITAMIN W/MINERALS CH
1.0000 | ORAL_TABLET | Freq: Every day | ORAL | Status: DC
  Administered 2021-12-07: 1 via ORAL
  Filled 2021-12-06: qty 1

## 2021-12-06 MED ORDER — ENSURE MAX PROTEIN PO LIQD
11.0000 [oz_av] | Freq: Two times a day (BID) | ORAL | Status: DC
  Administered 2021-12-07 – 2021-12-09 (×4): 11 [oz_av] via ORAL
  Filled 2021-12-06: qty 330

## 2021-12-06 MED ORDER — ASPIRIN EC 81 MG PO TBEC: 81.0000 mg | DELAYED_RELEASE_TABLET | Freq: Every day | ORAL | Status: DC

## 2021-12-06 MED ORDER — ALBUMIN HUMAN 25 % IV SOLN
50.0000 g | Freq: Once | INTRAVENOUS | Status: AC
  Administered 2021-12-06: 50 g via INTRAVENOUS
  Filled 2021-12-06: qty 200

## 2021-12-06 MED ORDER — METHYLPREDNISOLONE SODIUM SUCC 40 MG IJ SOLR
20.0000 mg | Freq: Once | INTRAMUSCULAR | Status: AC
  Administered 2021-12-06: 20 mg via INTRAVENOUS
  Filled 2021-12-06: qty 1

## 2021-12-06 MED ORDER — ALBUTEROL SULFATE (2.5 MG/3ML) 0.083% IN NEBU: 2.5000 mg | INHALATION_SOLUTION | RESPIRATORY_TRACT | Status: DC | PRN

## 2021-12-06 MED ORDER — APPLE CIDER VINEGAR 600 MG PO CAPS: 600.0000 mg | ORAL_CAPSULE | Freq: Three times a day (TID) | ORAL | Status: DC

## 2021-12-06 MED ORDER — PRAVASTATIN SODIUM 20 MG PO TABS: 20.0000 mg | ORAL_TABLET | Freq: Every evening | ORAL | Status: DC

## 2021-12-06 MED ORDER — FUROSEMIDE 10 MG/ML IJ SOLN: 20.0000 mg | Freq: Once | INTRAMUSCULAR | Status: DC

## 2021-12-06 MED ORDER — ACETAMINOPHEN 10 MG/ML IV SOLN
1000.0000 mg | Freq: Four times a day (QID) | INTRAVENOUS | Status: DC
  Administered 2021-12-06: 1000 mg via INTRAVENOUS
  Filled 2021-12-06 (×2): qty 100

## 2021-12-06 MED ORDER — OYSTER SHELL CALCIUM/D3 500-5 MG-MCG PO TABS
1.0000 | ORAL_TABLET | Freq: Two times a day (BID) | ORAL | Status: DC
  Administered 2021-12-07: 1 via ORAL
  Filled 2021-12-06: qty 1

## 2021-12-06 MED ORDER — IOHEXOL 350 MG/ML SOLN
75.0000 mL | Freq: Once | INTRAVENOUS | Status: DC | PRN
Start: 2021-12-06 — End: 2021-12-10

## 2021-12-06 MED ORDER — METHOCARBAMOL 500 MG PO TABS
500.0000 mg | ORAL_TABLET | Freq: Three times a day (TID) | ORAL | Status: DC | PRN
  Administered 2021-12-07: 500 mg via ORAL
  Filled 2021-12-06 (×3): qty 1

## 2021-12-06 MED ORDER — SODIUM CHLORIDE 0.9 % IV BOLUS
250.0000 mL | Freq: Once | INTRAVENOUS | Status: AC
Start: 2021-12-06 — End: 2021-12-06
  Administered 2021-12-06: 250 mL via INTRAVENOUS

## 2021-12-06 MED ORDER — CYANOCOBALAMIN 500 MCG PO TABS
1000.0000 ug | ORAL_TABLET | Freq: Every day | ORAL | Status: DC
  Administered 2021-12-07: 1000 ug via ORAL
  Filled 2021-12-06: qty 2

## 2021-12-06 MED ORDER — DONEPEZIL HCL 5 MG PO TABS
5.0000 mg | ORAL_TABLET | Freq: Every day | ORAL | Status: DC
  Filled 2021-12-06: qty 1

## 2021-12-06 MED ORDER — MIDODRINE HCL 5 MG PO TABS
10.0000 mg | ORAL_TABLET | Freq: Three times a day (TID) | ORAL | Status: DC
  Administered 2021-12-07: 10 mg via ORAL
  Filled 2021-12-06: qty 2

## 2021-12-06 MED ORDER — ACETAMINOPHEN 325 MG PO TABS
650.0000 mg | ORAL_TABLET | Freq: Four times a day (QID) | ORAL | Status: DC | PRN
Start: 2021-12-06 — End: 2021-12-10
  Administered 2021-12-07: 650 mg via ORAL
  Filled 2021-12-06: qty 2

## 2021-12-06 MED ORDER — OXYCODONE-ACETAMINOPHEN 5-325 MG PO TABS
1.0000 | ORAL_TABLET | ORAL | Status: DC | PRN
  Administered 2021-12-08 – 2021-12-10 (×4): 1 via ORAL
  Filled 2021-12-06 (×4): qty 1

## 2021-12-06 MED ORDER — ACETAMINOPHEN 650 MG RE SUPP: 650.0000 mg | Freq: Four times a day (QID) | RECTAL | Status: DC | PRN

## 2021-12-06 MED ORDER — MEMANTINE HCL 5 MG PO TABS
10.0000 mg | ORAL_TABLET | Freq: Two times a day (BID) | ORAL | Status: DC
  Administered 2021-12-07: 10 mg via ORAL
  Filled 2021-12-06 (×2): qty 2

## 2021-12-06 NOTE — ED Notes (Signed)
This RN transporting pt to CT on monitor  ?

## 2021-12-06 NOTE — ED Notes (Signed)
Pt soiled in feces, pt cleaned and dry at this time.  ?

## 2021-12-06 NOTE — ED Notes (Signed)
Echo and IV nurse at bedside  ? ?

## 2021-12-06 NOTE — Assessment & Plan Note (Addendum)
Likely due to multifactorial etiology, including concussion and hypoxia. ?

## 2021-12-06 NOTE — ED Notes (Signed)
Harry Hamper, Md at bedside  ? ?

## 2021-12-06 NOTE — Assessment & Plan Note (Addendum)
-   d/c'ed home donepezil, Namenda due to comfort care status ?

## 2021-12-06 NOTE — ED Notes (Signed)
Verbal orders to give another 249m bolus, pt's BP was 644Zto 714Usystolic.  ?

## 2021-12-06 NOTE — ED Notes (Addendum)
Pt current O2 saturation on 4L [place] 99%-100%, decreased O2 to 2L [place] at this time current saturation 95%  ?

## 2021-12-06 NOTE — ED Provider Notes (Addendum)
? ?[facility] ?Provider Note ? ? ? Event Date/Time  ? First MD Initiated Contact with Patient 12/06/21 [phone]   ?  (approximate) ? ? ?History  ? ?Fall ? ? ?HPI ? ?Harry Vargas is a 88 y.o. male who reports he tripped and fell.  He hit the back of his head.  He has a lump there.  Does not sound like he passed out.  He is also reporting pain in the low back now.  He says he is had that since he fell.  He says his fairly bad. ? ?  ? ? ?Physical Exam  ? ?Triage Vital Signs: ?ED Triage Vitals  ?Enc Vitals Group  ?   BP 12/06/21 0820 (!) 154/92  ?   Pulse Rate 12/06/21 0820 71  ?   Resp 12/06/21 0820 16  ?   Temp 12/06/21 0820 97.6 ?F (36.4 ?C)  ?   Temp Source 12/06/21 0820 Oral  ?   SpO2 12/06/21 0820 99 %  ?   Weight 12/06/21 0818 156 lb 8.4 oz (71 kg)  ?   Height 12/06/21 0818 '5\' 4"'$  (1.626 m)  ?   Head Circumference --   ?   Peak Flow --   ?   Pain Score 12/06/21 0817 8  ?   Pain Loc --   ?   Pain Edu? --   ?   Excl. in [place]? --   ? ? ?Most recent vital signs: ?Vitals:  ? 12/06/21 1545 12/06/21 1549  ?BP: (!) 128/106 120/60  ?Pulse: 77 78  ?Resp:    ?Temp:    ?SpO2: 96% 95%  ? ? ? ?General: Awake, no distress.  ?Head normocephalic atraumatic except for a 4 cm size lump on the right occiput. ?Neck is nontender ?CV:  Good peripheral perfusion.  Heart regular rate and rhythm no audible murmur ?Chest is nontender ?Resp:  Normal effort.  Lungs are clear ?Abd:  Belly slightly protuberant but not actually distended.  Patient reports is nontender ?Extremities arms and legs patient denies any tenderness or pain on palpation patient does complain of pain in the low back and when I palpate he has pain over the right sacroiliac area and the lower spine possibly L4-5.  He does not have any pain higher in the spine. ? ? ?ED Results / Procedures / Treatments  ? ?Labs ?(all labs ordered are listed, but only abnormal results are displayed) ?Labs Reviewed  ?CBC WITH DIFFERENTIAL/PLATELET - Abnormal; Notable for  the following components:  ?    Result Value  ? RBC 6.10 (*)   ? HCT 52.7 (*)   ? MCH 25.9 (*)   ? RDW 16.0 (*)   ? Abs Immature Granulocytes 0.16 (*)   ? All other components within normal limits  ?BASIC METABOLIC PANEL - Abnormal; Notable for the following components:  ? Chloride 96 (*)   ? CO2 33 (*)   ? All other components within normal limits  ?BRAIN NATRIURETIC PEPTIDE - Abnormal; Notable for the following components:  ? B Natriuretic Peptide 121.3 (*)   ? All other components within normal limits  ?RESP PANEL BY RT-PCR (FLU A&B, COVID) ARPGX2  ?LIPASE, BLOOD  ?LACTIC ACID, PLASMA  ?BLOOD GAS, ARTERIAL  ?URINALYSIS, COMPLETE (UACMP) WITH MICROSCOPIC  ?TROPONIN I (HIGH SENSITIVITY)  ? ? ? ?EKG ? ?EKG read interpreted by me shows normal sinus rhythm rate of 70 left axis EKG looks similar to 1 from 18 March of this  year but different from 21st March of this year. ? ? ?RADIOLOGY ?CT of the head and neck read by radiology films reviewed by me show no acute disease. ?Chest x-ray read by radiology reviewed by me shows no acute disease per radiology I do not disagree. ?Second chest x-ray read by radiology reviewed by me is stable I also reviewed that film initially when I saw it on the portable monitor looked CHF he but now in the computer comparing it to the other when it does look stable. ?CT of the chest abdomen pelvis done when the patient was becoming hypotensive and developing abdominal pain does not show any acute pathology either. ?CT of the T and L-spine read by radiology films reviewed by me only showed the possible subacute fracture in T12 and acute fracture of T11.  Compression fractures both of them. ?PROCEDURES: ? ?Critical Care performed: Critical care time about 1 hour.  This includes seeing the patient several times speaking with him and his daughter who is with him.  Additionally I visited Harry Vargas several times to check on his blood pressure which was dropping and then his oxygen saturation which  initially were good but then dropped after we gave him the fluids while his blood pressure came up.  We had to get him stable for CT.  He had been getting kind of groggy and then developed abdominal pain.  I did a bedside ultrasound but could not visualize anything in his abdomen he was complaining of a lot of pain and I was worried with his hypotension that he was developing an aneurysm or something similar.  Because I could not visualize his aorta with the ultrasound we got the CT scan which showed that there was nothing really going on.  Patient's blood pressure stabilized with several boluses of fluid but then his sats came down and he required oxygen supplementation which she normally did not get.  Some of this is reviewed in the time note below. ?Additionally I discussed the patient in detail with the hospitalist. ?Procedures ? ? ?MEDICATIONS ORDERED IN ED: ?Medications  ?furosemide (LASIX) injection 20 mg (20 mg Intravenous Not Given 12/06/21 1614)  ?ipratropium-albuterol (DUONEB) 0.5-2.5 (3) MG/3ML nebulizer solution 3 mL (has no administration in time range)  ?albuterol (PROVENTIL) (2.5 MG/3ML) 0.083% nebulizer solution 2.5 mg (has no administration in time range)  ?dextromethorphan-guaiFENesin (MUCINEX DM) 30-600 MG per 12 hr tablet 1 tablet (has no administration in time range)  ?acetaminophen (TYLENOL) tablet 650 mg (has no administration in time range)  ?acetaminophen (TYLENOL) suppository 650 mg (has no administration in time range)  ?ondansetron (ZOFRAN) injection 4 mg (has no administration in time range)  ?oxyCODONE-acetaminophen (PERCOCET/ROXICET) 5-325 MG per tablet 1 tablet (has no administration in time range)  ?methocarbamol (ROBAXIN) tablet 500 mg (has no administration in time range)  ?iohexol (OMNIPAQUE) 350 MG/ML injection 75 mL (has no administration in time range)  ?midodrine (PROAMATINE) tablet 10 mg (has no administration in time range)  ?albumin human 25 % solution 50 g (has no  administration in time range)  ?hydrocortisone sodium succinate (SOLU-CORTEF) 100 MG injection 100 mg (has no administration in time range)  ?aspirin EC tablet 81 mg (has no administration in time range)  ?pravastatin (PRAVACHOL) tablet 20 mg (has no administration in time range)  ?donepezil (ARICEPT) tablet 5 mg (has no administration in time range)  ?memantine (NAMENDA) tablet 10 mg (has no administration in time range)  ?vitamin B-12 (CYANOCOBALAMIN) tablet 1,000 mcg (has no administration in time  range)  ?calcium-vitamin D (OSCAL WITH D) 500-5 MG-MCG per tablet 1 tablet (has no administration in time range)  ?protein supplement (ENSURE MAX) liquid (has no administration in time range)  ?multivitamin with minerals tablet 1 tablet (has no administration in time range)  ?morphine (PF) 2 MG/ML injection 2 mg (2 mg Intravenous Given 12/06/21 0919)  ?sodium chloride 0.9 % bolus 250 mL (0 mLs Intravenous Stopped 12/06/21 1110)  ?sodium chloride 0.9 % bolus 250 mL (0 mLs Intravenous Stopped 12/06/21 1139)  ?iohexol (OMNIPAQUE) 300 MG/ML solution 100 mL (100 mLs Intravenous Contrast Given 12/06/21 1213)  ?sodium chloride 0.9 % bolus 250 mL (0 mLs Intravenous Stopped 12/06/21 1230)  ? ? ? ?IMPRESSION / MDM / ASSESSMENT AND PLAN / ED COURSE  ?I reviewed the triage vital signs and the nursing notes. ?----------------------------------------- ?12:05 PM on 12/06/2021 ?----------------------------------------- ?Patient with multiple blood pressure readings which are low.  Now saying his belly is hurting.  I attempted bedside ultrasound but patient got too much gas for me to see the aorta.  After some fluids his blood pressure comes up have several readings above 130.  I will send him for CT abdomen pelvis.  Has told his daughter he did not want any more surgery after he had his bypass.  He has been asking to go to hospice here.  He has been telling his daughter he is wants to die in the last week or 2. ? ? ?The patient is on the cardiac  monitor to evaluate for evidence of arrhythmia and/or significant heart rate changes.  Patient had some PVCs but otherwise nothing was seen. ? ?  ? ? ?FINAL CLINICAL IMPRESSION(S) / ED DIAGNOSES  ? ?Final diagnoses:

## 2021-12-06 NOTE — Assessment & Plan Note (Addendum)
Etiology is not clear.  No signs of infection, does not seem to have sepsis ? ?

## 2021-12-06 NOTE — ED Triage Notes (Addendum)
BIB [facility] from [facility], dizziness followed by skip and fall. Pt denies LOC. Pt has hematoma to posterior head. Pt takes Plavix. ?Difficulty hearing, alert and oriented X4 ? ?101 cbg with EMS, NSR ? ? ?Arrives on 4L [place], EMS did not report of any oxygen dependence or need to this RN or first nurse. Pt denies usually wearing oxygen. ?

## 2021-12-06 NOTE — ED Notes (Addendum)
Report received from Tom, RN.

## 2021-12-06 NOTE — ED Triage Notes (Signed)
Pt comes into the ED via EMS from [place] [facility], slipped and fell back hitting his head, denies LOC, alert on arrival ? ?146/79 ?

## 2021-12-06 NOTE — ED Notes (Signed)
Pt transported to XR.  

## 2021-12-06 NOTE — H&P (Signed)
?History and Physical  ? ? ?Harry Vargas WNU:[id number] DOB: [birth year] DOA: 12/06/2021 ? ?Referring MD/NP/PA:  ? ?PCP: Rusty Aus, MD  ? ?Patient coming from:  The patient is coming from SNF ? ? ?Chief Complaint: fall, back pain, SOB, altered mental status ? ?HPI: Harry Vargas is a 88 y.o. male with medical history significant of hypertension, hyperlipidemia, asthma, GERD, sinoatrial node dysfunction, s/p for pacemaker placement, prostate cancer, CAD, CABG, atrial fibrillation not on anticoagulants, Alzheimer's disease, dCHF, who presents with fall, back pain and shortness breath, altered mental status. ? ?Per patient's daughter at the bedside, patient fell after he slipped backwards and hit his head. No LOC.  Patient complains of back pain.  No headache or neck pain.  Patient is more confused than baseline per her daughter.  Patient was found to have shortness of breath, severe respiratory distress, and oxygen desaturation to 85% on room air.  BiPAP started in ED. Normally patient not using oxygen.  Patient does not have active cough, chest pain.  No nausea vomiting, diarrhea or abdominal pain. ? ?Patient was initially hypotensive with blood pressure 60/45, which improved to 123/98 after giving 250 cc normal saline bolus x3.  After given normal saline bolus, patient's respiratory distress has worsened. ? ?Data Reviewed and ED Course: pt was found to have WBC 9.9, troponin level 16, BNP 121, GFR> 60, temperature normal, heart rate 80, RR 30. CT Chest abdomen and pelvis showed interstitial markings with chronic features, stable RUL nodule and airspace disease in bilateral bases. No acute findings of the abdomen by CT scan. CT-of T spin shows compression fracture at T12 and possible compression deformities at T8 and T11.  CT of the head and C-spine is negative for acute injury, showed a right posterior head hematoma.  Patient is admitted to stepdown as inpatient.  Dr. Erin Fulling of ICU is consulted. ? ?EKG: I have  personally reviewed.  Sinus rhythm, QTc 421, LAD, poor R wave progression ? ? ?Review of Systems: Could not reviewed accurately due to altered mental status ? ? ?Allergy: No Known Allergies ? ?Past Medical History:  ?Diagnosis Date  ? Alzheimer's disease ([place])   ? Arthritis   ? Asthma   ? B12 deficiency   ? Bleeding disorder ([place])   ? Coronary artery disease   ? Dysrhythmia   ? atrial flutter  ? GERD (gastroesophageal reflux disease)   ? Hyperlipidemia   ? Hypertension   ? NSVT (nonsustained ventricular tachycardia) ([place])   ? Prostate cancer ([place]) 1990  ? Takes Lupron shots  ? Sinus node dysfunction ([facility])   ? ? ?Past Surgical History:  ?Procedure Laterality Date  ? BYPASS GRAFT    ? CHOLECYSTECTOMY    ? CIRCUMCISION    ? COLONOSCOPY WITH PROPOFOL N/A 09/19/2019  ? Procedure: COLONOSCOPY WITH PROPOFOL;  Surgeon: Toledo, [name], MD;  Location: [facility] ENDOSCOPY;  Service: Gastroenterology;  Laterality: N/A;  COVID - 44 done at [facility], report on chart  ? CORONARY ARTERY BYPASS GRAFT    ? ESOPHAGOGASTRODUODENOSCOPY (EGD) WITH PROPOFOL N/A 09/19/2019  ? Procedure: ESOPHAGOGASTRODUODENOSCOPY (EGD) WITH PROPOFOL;  Surgeon: Toledo, [name], MD;  Location: [facility] ENDOSCOPY;  Service: Gastroenterology;  Laterality: N/A;  ? INSERT / REPLACE / REMOVE PACEMAKER    ? ? ?Social History:  reports that he has never smoked. He has never used smokeless tobacco. He reports that he does not drink alcohol and does not use drugs. ? ?Family History:  ?Family History  ?Problem Relation  Age of Onset  ? Prostate cancer Neg Hx   ? Bladder Cancer Neg Hx   ? Kidney cancer Neg Hx   ?  ? ?Prior to Admission medications   ?Medication Sig Start Date End Date Taking? Authorizing Provider  ?acetaminophen (TYLENOL) 650 MG CR tablet Take 1,300 mg by mouth every 8 (eight) hours as needed.     [provider]  ?Apple Cider Vinegar 600 MG CAPS Take 600 mg by mouth 3 (three) times daily.     [provider]  ?aspirin EC 81 MG tablet Take 81 mg  by mouth daily.     [provider]  ?Calcium 600-200 MG-UNIT tablet Take 1 tablet by mouth 2 (two) times daily with a meal.    [provider]  ?clopidogrel (PLAVIX) 75 MG tablet Take 1 tablet (75 mg total) by mouth daily. 10/22/21   Annita Brod, MD  ?donepezil (ARICEPT) 5 MG tablet Take 5 mg by mouth at bedtime.    [provider]  ?Ensure Max Protein (ENSURE MAX PROTEIN) LIQD Take 330 mLs (11 oz total) by mouth 2 (two) times daily. 10/21/21   Annita Brod, MD  ?furosemide (LASIX) 20 MG tablet Take 20 mg by mouth daily. 07/18/21   [provider]  ?ipratropium (ATROVENT) 0.06 % nasal spray Place 1 spray into both nostrils 2 (two) times daily. 09/14/21   [provider]  ?Leuprolide Acetate, 6 Month, (LUPRON) 45 MG injection Inject 45 mg into the muscle every 6 (six) months.    [provider]  ?memantine (NAMENDA) 10 MG tablet Take 10 mg by mouth 2 (two) times daily. 09/13/21   [provider]  ?Multiple Vitamin (MULTIVITAMIN WITH MINERALS) TABS tablet Take 1 tablet by mouth daily. 10/22/21   Annita Brod, MD  ?polyethylene glycol powder (GLYCOLAX/MIRALAX) powder Take 17 g by mouth daily.     [provider]  ?pravastatin (PRAVACHOL) 20 MG tablet Take 20 mg by mouth every evening.     [provider]  ?vitamin B-12 (CYANOCOBALAMIN) 1000 MCG tablet Take 1,000 mcg by mouth daily.    [provider]  ? ? ?Physical Exam: ?Vitals:  ? 12/06/21 1630 12/06/21 1645 12/06/21 1800 12/06/21 1900  ?BP: 99/71 112/67 (!) 105/58 101/61  ?Pulse: 74 74 60 71  ?Resp:   (!) 21 18  ?Temp:      ?TempSrc:      ?SpO2: 96% 96% 97% 98%  ?Weight:      ?Height:      ? ?General: Not in acute distress ?HEENT: ?      Eyes: PERRL, EOMI, no scleral icterus. ?      ENT: No discharge from the ears and nose ?      Neck: No JVD, no bruit, no mass felt. ?Heme: No neck lymph node enlargement. ?Cardiac: S1/S2, RRR, No murmurs, No gallops or  rubs. ?Respiratory: No rales, wheezing, rhonchi or rubs. ?GI: Soft, nondistended, nontender, no organomegaly, BS present. ?GU: No hematuria ?Ext: Has trace leg edema bilaterally. 1+DP/PT pulse bilaterally. ?Musculoskeletal: No joint deformities, No joint redness or warmth, no limitation of ROM in spin. ?Skin: No rashes.  ?Neuro: Confused, knows his own name, partially following commands, not oriented to time and place, cranial nerves II-XII grossly intact, moves all extremities  ? ?Labs on Admission: I have personally reviewed following labs and imaging studies ? ?CBC: ?Recent Labs  ?Lab 12/06/21 ?8563  ?WBC 9.9  ?NEUTROABS 7.6  ?HGB 15.8  ?HCT  52.7*  ?MCV 86.4  ?PLT 171  ? ?Basic Metabolic Panel: ?Recent Labs  ?Lab 12/06/21 ?1610  ?NA 138  ?K 4.3  ?CL 96*  ?CO2 33*  ?GLUCOSE 93  ?BUN 19  ?CREATININE 1.00  ?CALCIUM 9.0  ? ?GFR: ?Estimated Creatinine Clearance: 41.1 mL/min (by C-G formula based on SCr of 1 mg/dL). ?Liver Function Tests: ?No results for input(s): AST, ALT, ALKPHOS, BILITOT, PROT, ALBUMIN in the last 168 hours. ?Recent Labs  ?Lab 12/06/21 ?9604  ?LIPASE 55*  ? ?No results for input(s): AMMONIA in the last 168 hours. ?Coagulation Profile: ?No results for input(s): INR, PROTIME in the last 168 hours. ?Cardiac Enzymes: ?No results for input(s): CKTOTAL, CKMB, CKMBINDEX, TROPONINI in the last 168 hours. ?BNP (last 3 results) ?No results for input(s): PROBNP in the last 8760 hours. ?HbA1C: ?No results for input(s): HGBA1C in the last 72 hours. ?CBG: ?No results for input(s): GLUCAP in the last 168 hours. ?Lipid Profile: ?No results for input(s): CHOL, HDL, LDLCALC, TRIG, CHOLHDL, LDLDIRECT in the last 72 hours. ?Thyroid Function Tests: ?No results for input(s): TSH, T4TOTAL, FREET4, T3FREE, THYROIDAB in the last 72 hours. ?Anemia Panel: ?No results for input(s): VITAMINB12, FOLATE, FERRITIN, TIBC, IRON, RETICCTPCT in the last 72 hours. ?Urine analysis: ?   ?Component Value Date/Time  ? COLORURINE YELLOW (A)  10/16/2021 1533  ? APPEARANCEUR CLEAR (A) 10/16/2021 1533  ? LABSPEC >1.046 (H) 10/16/2021 1533  ? PHURINE 7.0 10/16/2021 1533  ? GLUCOSEU NEGATIVE 10/16/2021 1533  ? [place] NEGATIVE 10/16/2021 1533  ? BILIRU

## 2021-12-06 NOTE — Assessment & Plan Note (Addendum)
-   d/c'ed aspirin, Plavix and pravastatin due to comfort care status ?

## 2021-12-06 NOTE — Assessment & Plan Note (Signed)
-   Hold blood pressure medications due to hypotension ?

## 2021-12-06 NOTE — ED Notes (Signed)
Pt becoming increasingly altered and trying to take off O2 and monitor. Md at bedside.  ?

## 2021-12-06 NOTE — ED Notes (Signed)
This RN at bedside. Pt BP reading 33O systolic, pt still alert and at his baseline. All other VS are stable. Dr. Cinda Quest, MD notified, pt has a hx of CHF, verbal orders from Dr. Cinda Quest for 242m bolus. Also, verbal orders to take O2 off, O2 currently 92% on RA.  ?

## 2021-12-06 NOTE — ED Notes (Signed)
RT at bedside.

## 2021-12-06 NOTE — ED Notes (Signed)
Spoke to Dr. Cinda Quest, MD, explained that he needed another 269m and I had to placed back on 2L [place] for an O2 of 87% on RA, per daughter pt is more altered and "talking out of his head", pt keeps stating he wanted to die. Last BP taken was 839Esystolic. Dr. MCinda Questat bedside at this time.  ?

## 2021-12-06 NOTE — Assessment & Plan Note (Deleted)
-   Hold Plavix and aspirin ?-Telemetry monitoring ?

## 2021-12-06 NOTE — Consult Note (Signed)
? ?NAME:  Harry Vargas, MRN:  [id number], DOB:  October 11, 1928, LOS: 0 ?ADMISSION DATE:  12/06/2021, CONSULTATION DATE:  12/06/21 ?REFERRING MD: Ivor Costa, MD CHIEF COMPLAINT:  Respiratory Failure  ? ?History of Present Illness:  ?Harry Vargas is 88 year old male with coronary artery disease on plavix, alzheimer's dementia and combined systolic and diastolic heart failure who is admitted for acute hypoxemic respiratory failure requiring bipap in the ER. He suffered a fall at [facility] facility where he slipped backwards and hit his head without loss of consciousness. Radiology evaluation shows compression fracture at T12 and possible compression deformities at T8 and T11. CT Chest abdomen and pelvis showed interstitial markings with chronic features, stable RUL nodule and airspace disease in bilateral bases. No acute findings of the abdomen.  ? ?PCCM has been consulted for further evaluation of respiratory failure. ? ?I spoke with patient's daughter Jackelyn Poling at the bedside. Patient has had decreasing mental status since arrival to the ER. He is tolerating bipap well at this time. Per daughter, patient has expressed wishes to go to hospice over recent days/weeks. She is ok with trial of bipap and vasopressor support if needed.  ? ?Pertinent  Medical History  ?Coronary Artery Disease ?Pacemaker ?Alzheimer's Dementia ?Systolic and Diastolic CHF ?Hypertension ?Dysphagia ? ?Significant Hospital Events: ?Including procedures, antibiotic start and stop dates in addition to other pertinent events   ?5/8 admitted, requiring Bipap therapy ? ?Interim History / Subjective:  ?As above ? ?Objective   ?Blood pressure 120/60, pulse 78, temperature 97.6 ?F (36.4 ?C), temperature source Oral, resp. rate (!) 21, height '5\' 4"'$  (1.626 m), weight 71 kg, SpO2 95 %. ?   ?   ? ?Intake/Output Summary (Last 24 hours) at 12/06/2021 1626 ?Last data filed at 12/06/2021 1233 ?Gross per 24 hour  ?Intake 850 ml  ?Output --  ?Net 850 ml  ? ?Filed  Weights  ? 12/06/21 0818  ?Weight: 71 kg  ? ? ?Examination: ?General: elderly male, no acute distress, bipap in place ?HENT: hematoma right posterior head, moist mucous membranes, sclera anicteric ?Lungs: diminished breath sounds on left, clear to auscultation on right. No wheezing ?Cardiovascular: rrr, no murmurs ?Abdomen: soft, non-distended, non-tender, BS+ ?Extremities: warm, no edema ?Neuro: opens eyes spontaneously, does not follow commands, PERRL ?GU: diaper in place ? ?Resolved Hospital Problem list   ? ? ?Assessment & Plan:  ?Acute Hypoxemic and Hypercapnic Respiratory Failure  ?In setting of recent fall with head trauma leading to decline in neurologic status along with chronic interstitial changes concerning for mild underlying ILD/Fibrosis ?- Continue bipap support ?- Patient is DNR/DNI ?- would not recommend further workup/evaluation of suspected ILD given he did not require O2 prior to admission ?- Check procalcitonin, low concern for pneumonia at this time ?- Low concern for PE at this time, would recommend holding off on CTA chest given his contrast load received from early CT scans ? ?Acute Encephalopathy ?Possible traumatic brain injury due to fall from standing ?Alzheimer's Dementia ?Hypercapnia as above ?- Continue bipap ?- repeat CT head to make sure no intracranial bleeding has occurred ? ?Chronic Systolic and Diastolic Heart Failure ?- BNP not elevated, less concern for exacerbation at this time ?- continue to monitor ? ?Hypotension ?- appears to occur when patient is sleeping ?- continue to monitor ?- patient family amenable to vasopressor trial via peripheral IV ? ?Disposition: Recommend palliative care consult based on patient's stated wishes to daughter of recent days to transition to hospice care.  ? ?  PCCM will continue to follow ? ?Best Practice (right click and "Reselect all SmartList Selections" daily)  ? ?Per primary ? ?Labs   ?CBC: ?Recent Labs  ?Lab 12/06/21 ?4650  ?WBC 9.9   ?NEUTROABS 7.6  ?HGB 15.8  ?HCT 52.7*  ?MCV 86.4  ?PLT 171  ? ? ?Basic Metabolic Panel: ?Recent Labs  ?Lab 12/06/21 ?3546  ?NA 138  ?K 4.3  ?CL 96*  ?CO2 33*  ?GLUCOSE 93  ?BUN 19  ?CREATININE 1.00  ?CALCIUM 9.0  ? ?GFR: ?Estimated Creatinine Clearance: 41.1 mL/min (by C-G formula based on SCr of 1 mg/dL). ?Recent Labs  ?Lab 12/06/21 ?5681  ?WBC 9.9  ? ? ?Liver Function Tests: ?No results for input(s): AST, ALT, ALKPHOS, BILITOT, PROT, ALBUMIN in the last 168 hours. ?No results for input(s): LIPASE, AMYLASE in the last 168 hours. ?No results for input(s): AMMONIA in the last 168 hours. ? ?ABG ?No results found for: PHART, PCO2ART, PO2ART, HCO3, TCO2, ACIDBASEDEF, O2SAT  ? ?Coagulation Profile: ?No results for input(s): INR, PROTIME in the last 168 hours. ? ?Cardiac Enzymes: ?No results for input(s): CKTOTAL, CKMB, CKMBINDEX, TROPONINI in the last 168 hours. ? ?HbA1C: ?Hgb A1c MFr Bld  ?Date/Time Value Ref Range Status  ?10/16/2021 03:38 PM 5.6 4.8 - 5.6 % Final  ?  Comment:  ?  (NOTE) ?        Prediabetes: 5.7 - 6.4 ?        Diabetes: >6.4 ?        Glycemic control for adults with diabetes: <7.0 ?  ?10/16/2021 02:17 PM 5.5 4.8 - 5.6 % Final  ?  Comment:  ?  (NOTE) ?        Prediabetes: 5.7 - 6.4 ?        Diabetes: >6.4 ?        Glycemic control for adults with diabetes: <7.0 ?  ? ? ?CBG: ?No results for input(s): GLUCAP in the last 168 hours. ? ?Review of Systems:   ?Unable to perform ROS due to mental status ? ?Past Medical History:  ?He,  has a past medical history of Alzheimer's disease ([place]), Arthritis, Asthma, B12 deficiency, Bleeding disorder ([place]), Coronary artery disease, Dysrhythmia, GERD (gastroesophageal reflux disease), Hyperlipidemia, Hypertension, NSVT (nonsustained ventricular tachycardia) ([place]), Prostate cancer ([place]) (1990), and Sinus node dysfunction ([place]).  ? ?Surgical History:  ? ?Past Surgical History:  ?Procedure Laterality Date  ? BYPASS GRAFT    ? CHOLECYSTECTOMY    ? CIRCUMCISION    ?  COLONOSCOPY WITH PROPOFOL N/A 09/19/2019  ? Procedure: COLONOSCOPY WITH PROPOFOL;  Surgeon: Toledo, [name], MD;  Location: [facility] ENDOSCOPY;  Service: Gastroenterology;  Laterality: N/A;  COVID - 55 done at [facility], report on chart  ? CORONARY ARTERY BYPASS GRAFT    ? ESOPHAGOGASTRODUODENOSCOPY (EGD) WITH PROPOFOL N/A 09/19/2019  ? Procedure: ESOPHAGOGASTRODUODENOSCOPY (EGD) WITH PROPOFOL;  Surgeon: Toledo, [name], MD;  Location: [facility] ENDOSCOPY;  Service: Gastroenterology;  Laterality: N/A;  ? INSERT / REPLACE / REMOVE PACEMAKER    ?  ? ?Social History:  ? reports that he has never smoked. He has never used smokeless tobacco. He reports that he does not drink alcohol and does not use drugs.  ? ?Family History:  ?His family history is negative for Prostate cancer, Bladder Cancer, and Kidney cancer.  ? ?Allergies ?No Known Allergies  ? ?Home Medications  ?Prior to Admission medications   ?Medication Sig Start Date End Date Taking? Authorizing Provider  ?acetaminophen (TYLENOL) 650 MG CR tablet Take  1,300 mg by mouth every 8 (eight) hours as needed.    Yes [provider]  ?Apple Cider Vinegar 600 MG CAPS Take 600 mg by mouth 3 (three) times daily.    Yes [provider]  ?aspirin EC 81 MG tablet Take 81 mg by mouth daily.    Yes [provider]  ?Calcium 600-200 MG-UNIT tablet Take 1 tablet by mouth 2 (two) times daily with a meal.   Yes [provider]  ?clopidogrel (PLAVIX) 75 MG tablet Take 1 tablet (75 mg total) by mouth daily. 10/22/21  Yes Annita Brod, MD  ?donepezil (ARICEPT) 5 MG tablet Take 5 mg by mouth at bedtime.   Yes [provider]  ?Ensure Max Protein (ENSURE MAX PROTEIN) LIQD Take 330 mLs (11 oz total) by mouth 2 (two) times daily. 10/21/21  Yes Annita Brod, MD  ?furosemide (LASIX) 20 MG tablet Take 20 mg by mouth daily. 07/18/21  Yes [provider]  ?ipratropium (ATROVENT) 0.06 % nasal spray Place 1 spray into both nostrils 2 (two)  times daily. 09/14/21  Yes [provider]  ?Leuprolide Acetate, 6 Month, (LUPRON) 45 MG injection Inject 45 mg into the muscle every 6 (six) months.   Yes [provider]  ?memantine (NAMENDA) 10 MG tabl

## 2021-12-06 NOTE — ED Notes (Signed)
Called lab at this time to add pt's troponin level and BNP to his previous blood work.  ?

## 2021-12-06 NOTE — Assessment & Plan Note (Addendum)
--  d/c'ed statin due to comfort care status ?

## 2021-12-06 NOTE — Assessment & Plan Note (Addendum)
-  consulted Dr. Cari Caraway of of neurosurgery, no need for brace ?

## 2021-12-06 NOTE — Assessment & Plan Note (Signed)
2D echo on 03/03/2020 showed EF of> 55%.  Patient has trace leg edema, BNP is 121, does not seem to have CHF exacerbation. ?-Hold Lasix due to hypotension ?

## 2021-12-06 NOTE — Assessment & Plan Note (Addendum)
Patient has acute respiratory failure with hypoxia.  Currently on BiPAP.  Etiology is not clear.  Consulted to Dr. Erin Fulling of ICU.  Per Dr. Erin Fulling, patient has chronic interstitial changes concerning for mild underlying ILD/Fibrosis.  He has low suspicions of PE, therefore canceled CT angiogram. ?--pt now comfort care.  Supplemental oxygen removed, O2 sats mid 80s.  No signs of respiratory distress currently. ? ? ? ?

## 2021-12-06 NOTE — Assessment & Plan Note (Addendum)
-   Patient was on Lupron injection every 6 months ?

## 2021-12-06 NOTE — ED Notes (Addendum)
Harry Vargas (daughter) ?    ?     ** [phone] ? ?Debbie [employer] manager) ?   ?     **[phone]  ** ?

## 2021-12-06 NOTE — ED Notes (Signed)
Dr. Cinda Quest, MD at bedside for evaluation.  ?

## 2021-12-06 NOTE — Assessment & Plan Note (Addendum)
-   Fall precaution ?--no PT/OT due to comfort care status ?

## 2021-12-06 NOTE — ED Notes (Addendum)
While rounding on patient, he has taken off his Bipap mask and his monitoring equipment. Pt asks, "where is Academic librarian?". Reoriented and redirected patient and explained that his family has gone home for awhile and will be back. Patient verbalized understanding. Reintroduced myself and requested that pt leave all of his monitoring equipment and Bipap mask on, pt verbalized understanding. ?

## 2021-12-07 ENCOUNTER — Inpatient Hospital Stay: Payer: Medicare Other

## 2021-12-07 ENCOUNTER — Encounter: Payer: Self-pay | Admitting: Internal Medicine

## 2021-12-07 DIAGNOSIS — J9690 Respiratory failure, unspecified, unspecified whether with hypoxia or hypercapnia: Secondary | ICD-10-CM | POA: Diagnosis present

## 2021-12-07 DIAGNOSIS — F0280 Dementia in other diseases classified elsewhere without behavioral disturbance: Secondary | ICD-10-CM | POA: Diagnosis not present

## 2021-12-07 DIAGNOSIS — Z7189 Other specified counseling: Secondary | ICD-10-CM

## 2021-12-07 DIAGNOSIS — R627 Adult failure to thrive: Secondary | ICD-10-CM | POA: Diagnosis not present

## 2021-12-07 DIAGNOSIS — J9601 Acute respiratory failure with hypoxia: Secondary | ICD-10-CM | POA: Diagnosis not present

## 2021-12-07 DIAGNOSIS — G309 Alzheimer's disease, unspecified: Secondary | ICD-10-CM | POA: Diagnosis not present

## 2021-12-07 LAB — CBC
HCT: 42.6 % (ref 39.0–52.0)
Hemoglobin: 12.8 g/dL — ABNORMAL LOW (ref 13.0–17.0)
MCH: 25.8 pg — ABNORMAL LOW (ref 26.0–34.0)
MCHC: 30 g/dL (ref 30.0–36.0)
MCV: 85.9 fL (ref 80.0–100.0)
Platelets: 139 10*3/uL — ABNORMAL LOW (ref 150–400)
RBC: 4.96 MIL/uL (ref 4.22–5.81)
RDW: 16.1 % — ABNORMAL HIGH (ref 11.5–15.5)
WBC: 10.9 10*3/uL — ABNORMAL HIGH (ref 4.0–10.5)
nRBC: 0 % (ref 0.0–0.2)

## 2021-12-07 LAB — URINALYSIS, COMPLETE (UACMP) WITH MICROSCOPIC
Bacteria, UA: NONE SEEN
Bilirubin Urine: NEGATIVE
Glucose, UA: NEGATIVE mg/dL
Hgb urine dipstick: NEGATIVE
Ketones, ur: NEGATIVE mg/dL
Leukocytes,Ua: NEGATIVE
Nitrite: NEGATIVE
Protein, ur: 30 mg/dL — AB
Specific Gravity, Urine: >1.046 — ABNORMAL HIGH (ref 1.005–1.030)
pH: 7 (ref 5.0–8.0)

## 2021-12-07 LAB — BLOOD GAS, ARTERIAL
Acid-Base Excess: 8 mmol/L — ABNORMAL HIGH (ref 0.0–2.0)
Bicarbonate: 35.8 mmol/L — ABNORMAL HIGH (ref 20.0–28.0)
Delivery systems: POSITIVE
Expiratory PAP: 5 cmH2O
FIO2: 40 %
Inspiratory PAP: 10 cmH2O
Mechanical Rate: 8
O2 Saturation: 98.2 %
Patient temperature: 37
pCO2 arterial: 62 mmHg — ABNORMAL HIGH (ref 32–48)
pH, Arterial: 7.37 (ref 7.35–7.45)
pO2, Arterial: 90 mmHg (ref 83–108)

## 2021-12-07 LAB — MAGNESIUM: Magnesium: 2.1 mg/dL (ref 1.7–2.4)

## 2021-12-07 LAB — BASIC METABOLIC PANEL
Anion gap: 9 (ref 5–15)
BUN: 25 mg/dL — ABNORMAL HIGH (ref 8–23)
CO2: 32 mmol/L (ref 22–32)
Calcium: 8.8 mg/dL — ABNORMAL LOW (ref 8.9–10.3)
Chloride: 97 mmol/L — ABNORMAL LOW (ref 98–111)
Creatinine, Ser: 1.18 mg/dL (ref 0.61–1.24)
GFR, Estimated: 59 mL/min — ABNORMAL LOW (ref >60)
Glucose, Bld: 118 mg/dL — ABNORMAL HIGH (ref 70–99)
Potassium: 4 mmol/L (ref 3.5–5.1)
Sodium: 138 mmol/L (ref 135–145)

## 2021-12-07 LAB — RESP PANEL BY RT-PCR (FLU A&B, COVID) ARPGX2
Influenza A by PCR: NEGATIVE
Influenza B by PCR: NEGATIVE
SARS Coronavirus 2 by RT PCR: NEGATIVE

## 2021-12-07 LAB — PROCALCITONIN: Procalcitonin: <0.1 ng/mL

## 2021-12-07 LAB — LACTIC ACID, PLASMA: Lactic Acid, Venous: 1.7 mmol/L (ref 0.5–1.9)

## 2021-12-07 MED ORDER — GLYCOPYRROLATE 0.2 MG/ML IJ SOLN
0.2000 mg | INTRAMUSCULAR | Status: DC | PRN
  Filled 2021-12-07: qty 1

## 2021-12-07 MED ORDER — MORPHINE SULFATE (PF) 2 MG/ML IV SOLN
1.0000 mg | INTRAVENOUS | Status: DC | PRN
  Administered 2021-12-07 – 2021-12-09 (×4): 1 mg via INTRAVENOUS
  Filled 2021-12-07 (×4): qty 1

## 2021-12-07 MED ORDER — MORPHINE SULFATE (CONCENTRATE) 10 MG/0.5ML PO SOLN: 5.0000 mg | ORAL | Status: DC | PRN

## 2021-12-07 MED ORDER — GLYCOPYRROLATE 1 MG PO TABS
1.0000 mg | ORAL_TABLET | ORAL | Status: DC | PRN
  Filled 2021-12-07: qty 1

## 2021-12-07 MED ORDER — MORPHINE SULFATE (CONCENTRATE) 10 MG/0.5ML PO SOLN
5.0000 mg | ORAL | Status: DC | PRN
  Administered 2021-12-08 – 2021-12-09 (×2): 5 mg via ORAL
  Filled 2021-12-07 (×2): qty 0.5

## 2021-12-07 MED ORDER — IPRATROPIUM-ALBUTEROL 0.5-2.5 (3) MG/3ML IN SOLN: 3.0000 mL | Freq: Four times a day (QID) | RESPIRATORY_TRACT | Status: DC | PRN

## 2021-12-07 NOTE — Progress Notes (Signed)
Nutrition Brief Note  Chart reviewed. Pt now transitioning to comfort care.  No further nutrition interventions planned at this time.  Please re-consult as needed.   Dennis Hegeman W, RD, LDN, CDCES Registered Dietitian II Certified Diabetes Care and Education Specialist Please refer to [facility] for RD and/or RD on-call/weekend/after hours pager   

## 2021-12-07 NOTE — Consult Note (Addendum)
Consultation Note Date: 12/07/2021   Patient Name: Harry Vargas  DOB: 21-Aug-1928  MRN: [id number]  Age / Sex: 88 y.o., male  PCP: Rusty Aus, MD Referring Physician: Wyvonnia Dusky, MD  Reason for Consultation: Establishing goals of care  HPI/Patient Profile: Harry Vargas is a 88 y.o. male with medical history significant of hypertension, hyperlipidemia, asthma, GERD, sinoatrial node dysfunction, s/p for pacemaker placement, prostate cancer, CAD, CABG, atrial fibrillation not on anticoagulants, Alzheimer's disease, dCHF, who presents with fall, back pain and shortness breath, altered mental status.  Clinical Assessment and Goals of Care: Notes and labs reviewed. In to speak with patient. He complains of back pain. Daughter is at bedside. She tells me patient is widowed and she is an only child and has no help. Daughter discusses that he had been living at home and had numerous falls. She states she had paid caregivers in the home 5 days per week, but states either they did not provide the care he needed or "no call no showed". She tells me he was sent to [facility] and has had several falls there including the one involved in this visit.   She discusses his decline cognitively. She tells me on bad days he does not recognize her. Today he is able to read a name and phone number off the white board in the room, but asked numerous times if he is in [place]. She states "he requires cuing for everything", and requires being bathed. She states she realizes she can not care for him at home, and is in the process of the paperwork for him to become a LTC resident at [facility].   She tells me he has voiced being ready to go for a couple of weeks now. Spoke with patient. He discusses being widowed years ago. He tells me he is ready to die, and is ready to go to heaven. He discusses his faith. He confirms no  resuscitation and no life support.   We discussed his diagnosis, prognosis, GOC, EOL wishes disposition and options.  Created space and opportunity for patient  to explore thoughts and feelings regarding current medical information.  A detailed discussion was had today regarding advanced directives.  Concepts specific to code status, artifical feeding and hydration, IV antibiotics and rehospitalization were discussed.  The difference between an aggressive medical intervention path and a comfort care path was discussed.  Values and goals of care important to patient and family were attempted to be elicited.  Discussed limitations of medical interventions to prolong quality of life in some situations and discussed the concept of human mortality.  Attending in to bedside during our conversation. We discussed his current rehab and return to rehab, as well as intent for LTC placement. We discussed his thoughts on being ready to die, not desiring any life prolonging care, and a focus on comfort and dignity. Discussed no escalation in care. Discussed focusing on his comfort and allowing him to do things on his terms and seeing how he does.  Discussed removing the oxygen he is currently wearing which is drying his nose, for comfort. Discussed treating his back pain. Discussed that if he declines, he be shifted to comfort care with plans to engage hospice. Discussed if while he is kept comfortable on his terms there is no decline, to look towards D/C back to [facility] with palliative to follow.   Daughter does not want him to return to the hospital if discharged back to [facility], and would want him tucked in and hospice to be initiated if there is decline at [facility].           SUMMARY OF RECOMMENDATIONS   Time for outcomes. No escalation in care. Focus on his comfort and doing things on his terms. If he declines, engage hospice. If no decline, return to [facility] with palliative to follow.   Attending MD was  present during this conversation.      Primary Diagnoses: Present on Admission:  Fall  Acute respiratory failure with hypoxia (HCC)  Compression fracture of thoracic vertebra (HCC)  Chronic diastolic CHF (congestive heart failure) (HCC)  Benign essential hypertension  Hyperlipidemia, mixed  Alzheimer's disease ([place])  Prostate cancer ([place])  CAD (coronary artery disease)  Atrial fibrillation, chronic ([place])  Acute metabolic encephalopathy  Hypotension  Respiratory failure ([place])   I have reviewed the medical record, interviewed the patient and family, and examined the patient. The following aspects are pertinent.  Past Medical History:  Diagnosis Date   Alzheimer's disease ([place])    Arthritis    Asthma    B12 deficiency    Bleeding disorder (HCC)    Coronary artery disease    Dysrhythmia    atrial flutter   GERD (gastroesophageal reflux disease)    Hyperlipidemia    Hypertension    NSVT (nonsustained ventricular tachycardia) ([place])    Prostate cancer ([place]) 1990   Takes Lupron shots   Sinus node dysfunction (HCC)    Social History   Socioeconomic History   Marital status: Widowed    Spouse name: Not on file   Number of children: Not on file   Years of education: Not on file   Highest education level: Not on file  Occupational History   Not on file  Tobacco Use   Smoking status: Never   Smokeless tobacco: Never  Vaping Use   Vaping Use: Never used  Substance and Sexual Activity   Alcohol use: No   Drug use: No   Sexual activity: Not on file  Other Topics Concern   Not on file  Social History Narrative   Not on file   Social Determinants of Health   Financial Resource Strain: Not on file  Food Insecurity: Not on file  Transportation Needs: Not on file  Physical Activity: Not on file  Stress: Not on file  Social Connections: Not on file   Family History  Problem Relation Age of Onset   Prostate cancer Neg Hx    Bladder Cancer Neg Hx    Kidney cancer  Neg Hx    Scheduled Meds:  ipratropium-albuterol  3 mL Nebulization Q4H   Ensure Max Protein  11 oz Oral BID   Continuous Infusions: PRN Meds:.acetaminophen, acetaminophen, glycopyrrolate **OR** glycopyrrolate **OR** glycopyrrolate, iohexol, methocarbamol, morphine injection, morphine CONCENTRATE **OR** morphine CONCENTRATE, ondansetron (ZOFRAN) IV, oxyCODONE-acetaminophen Medications Prior to Admission:  Prior to Admission medications   Medication Sig Start Date End Date Taking? Authorizing Provider  acetaminophen (TYLENOL) 650 MG CR tablet Take 1,300 mg by mouth every 8 (  eight) hours as needed.    Yes [provider]  Apple Cider Vinegar 600 MG CAPS Take 600 mg by mouth 3 (three) times daily.    Yes [provider]  aspirin EC 81 MG tablet Take 81 mg by mouth daily.    Yes [provider]  Calcium 600-200 MG-UNIT tablet Take 1 tablet by mouth 2 (two) times daily with a meal.   Yes [provider]  clopidogrel (PLAVIX) 75 MG tablet Take 1 tablet (75 mg total) by mouth daily. 10/22/21  Yes Annita Brod, MD  donepezil (ARICEPT) 5 MG tablet Take 5 mg by mouth at bedtime.   Yes [provider]  Ensure Max Protein (ENSURE MAX PROTEIN) LIQD Take 330 mLs (11 oz total) by mouth 2 (two) times daily. 10/21/21  Yes Annita Brod, MD  furosemide (LASIX) 20 MG tablet Take 20 mg by mouth daily. 07/18/21  Yes [provider]  ipratropium (ATROVENT) 0.06 % nasal spray Place 1 spray into both nostrils 2 (two) times daily. 09/14/21  Yes [provider]  Leuprolide Acetate, 6 Month, (LUPRON) 45 MG injection Inject 45 mg into the muscle every 6 (six) months.   Yes [provider]  memantine (NAMENDA) 10 MG tablet Take 10 mg by mouth 2 (two) times daily. 09/13/21  Yes [provider]  Multiple Vitamin (MULTIVITAMIN WITH MINERALS) TABS tablet Take 1 tablet by mouth daily. 10/22/21  Yes Annita Brod, MD  Multiple  Vitamins-Minerals (CENTRAL-VITE FOR SENIORS PO) Take by mouth.   Yes [provider]  polyethylene glycol powder (GLYCOLAX/MIRALAX) powder Take 17 g by mouth daily.    Yes [provider]  pravastatin (PRAVACHOL) 20 MG tablet Take 20 mg by mouth every evening.    Yes [provider]  vitamin B-12 (CYANOCOBALAMIN) 500 MCG tablet Take 1,000 mcg by mouth daily.   Yes [provider]   No Known Allergies Review of Systems  All other systems reviewed and are negative.  Physical Exam Pulmonary:     Effort: Pulmonary effort is normal.  Neurological:     Mental Status: He is alert.    Vital Signs: BP 114/61   Pulse 65   Temp 97.6 F (36.4 C) (Oral)   Resp (!) 21   Ht '5\' 4"'$  (1.626 m)   Wt 71 kg   SpO2 (!) 84% Comment: O2 removed by MD pt is comfort care  BMI 26.87 kg/m  Pain Scale: 0-10   Pain Score: Asleep   SpO2: SpO2: (!) 84 % (O2 removed by MD pt is comfort care) O2 Device:SpO2: (!) 84 % (O2 removed by MD pt is comfort care) O2 Flow Rate: .O2 Flow Rate (L/min): 2 L/min  IO: Intake/output summary:  Intake/Output Summary (Last 24 hours) at 12/07/2021 1408 Last data filed at 12/06/2021 1913 Gross per 24 hour  Intake 50 ml  Output --  Net 50 ml    LBM:   Baseline Weight: Weight: 71 kg Most recent weight: Weight: 71 kg      Signed by: Asencion Gowda, NP   Please contact Palliative Medicine Team phone at [phone] for questions and concerns.  For individual provider: See Shea Evans

## 2021-12-07 NOTE — ED Notes (Signed)
Called RT to assist with adding water to O2. Daughter at the bedside ?

## 2021-12-07 NOTE — Progress Notes (Signed)
?PROGRESS NOTE ? ? ?HPI was taken from Dr. Blaine Hamper: ?Harry Vargas is a 88 y.o. male with medical history significant of hypertension, hyperlipidemia, asthma, GERD, sinoatrial node dysfunction, s/p for pacemaker placement, prostate cancer, CAD, CABG, atrial fibrillation not on anticoagulants, Alzheimer's disease, dCHF, who presents with fall, back pain and shortness breath, altered mental status. ?  ?Per patient's daughter at the bedside, patient fell after he slipped backwards and hit his head. No LOC.  Patient complains of back pain.  No headache or neck pain.  Patient is more confused than baseline per her daughter.  Patient was found to have shortness of breath, severe respiratory distress, and oxygen desaturation to 85% on room air.  BiPAP started in ED. Normally patient not using oxygen.  Patient does not have active cough, chest pain.  No nausea vomiting, diarrhea or abdominal pain. ?  ?Patient was initially hypotensive with blood pressure 60/45, which improved to 123/98 after giving 250 cc normal saline bolus x3.  After given normal saline bolus, patient's respiratory distress has worsened. ?  ?Data Reviewed and ED Course: pt was found to have WBC 9.9, troponin level 16, BNP 121, GFR> 60, temperature normal, heart rate 80, RR 30. CT Chest abdomen and pelvis showed interstitial markings with chronic features, stable RUL nodule and airspace disease in bilateral bases. No acute findings of the abdomen by CT scan. CT-of T spin shows compression fracture at T12 and possible compression deformities at T8 and T11.  CT of the head and C-spine is negative for acute injury, showed a right posterior head hematoma.  Patient is admitted to stepdown as inpatient.  Dr. Erin Fulling of ICU is consulted. ?  ? ? ?CARLESS SLATTEN  VEL:[id number] DOB: 1929-04-10 DOA: 12/06/2021 ?PCP: Rusty Aus, MD  ? ?Assessment & Plan: ?  ?Principal Problem: ?  Acute respiratory failure with hypoxia ([place]) ?Active Problems: ?  Benign essential  hypertension ?  Hyperlipidemia, mixed ?  Fall ?  Compression fracture of thoracic vertebra (HCC) ?  Chronic diastolic CHF (congestive heart failure) ([place]) ?  Alzheimer's disease ([place]) ?  Prostate cancer ([place]) ?  CAD (coronary artery disease) ?  Atrial fibrillation, chronic ([place]) ?  Acute metabolic encephalopathy ?  Hypotension ? ?Assessment and Plan: ?Failure to thrive: secondary to all below. After a discussion w/ palliative care, the pt's daughter and myself, pt's daughter proceed to go with comfort care only. Pt currently does not meet inpatient hospice criteria- of likely death in 2 weeks as per palliative care. Will continue w/ comfort care here for now and depending on how the pt does w/ either further decline or improves, pt will either potentially go back to SNF w/ palliative care & later transfer to hospice care or become eligible for inpatient hospice. Palliative care will notify hospice to make them aware of the pt. Please see NP Crystal Griffin's note for more information.  ? ? ?Acute respiratory hypoxic failure: etiology is unclear. 86% on 4L [place]. Continue on BiPAP and wean as tolerated. CT chest shows possible patchy pneumonia cannot be excluded. Continue on bronchodilators & encourage incentive spirometry. COVID19 & influenza are both neg. Comfort care only. No oxygen  ?  ?HTN: holding home dose of lasix secondary to hypotension  ?  ?Hypotension: d/c midodrine. Keep MAP >65. Comfort care only  ?  ?Acute metabolic encephalopathy: likely secondary to concussion & hypoxia. CT head shows no acute intracranial abnormalities   ?  ?Chronic a. fib: not on any rate controlling  meds as per med rec  ? ?CAD: continue to hold aspirin, plavix secondary to hematoma.  ? ?Prostate cancer: on lupron Q6 months  ?  ?Alzheimer's disease: comfort care only  ?  ?Chronic diastolic CHF: echo on 10/06/3426 showed EF of> 55%. Trace leg edema, BNP is 121. Appears compensated. Hold lasix  ?  ?Compression fracture of thoracic  vertebra: percocet, robaxin prn for pain. Nothing further to do currently as per neuro surg  ?  ?Fall: comfort care only  ?  ?HLD: comfort care only  ? ? ? ? ? ?DVT prophylaxis: comfort care only  ?Code Status: DNR ?Family Communication: discussed pt's care w/ pt's daughter, Neoma Laming, and answered her questions  ?Disposition Plan: unclear, comfort care only . ? ?Level of care: med surg  ? ?Consultants:  ?Palliative care  ? ? ?Procedures:  ? ?Antimicrobials: ? ? ?Subjective: ?Pt c/o malaise  ? ?Objective: ?Vitals:  ? 12/07/21 0600 12/07/21 0630 12/07/21 0700 12/07/21 0800  ?BP: 119/68 137/70 (!) 103/91 (!) 108/92  ?Pulse: 64 68    ?Resp: 18 15 (!) 27   ?Temp:      ?TempSrc:      ?SpO2: 98% 98%    ?Weight:      ?Height:      ? ? ?Intake/Output Summary (Last 24 hours) at 12/07/2021 0816 ?Last data filed at 12/06/2021 1913 ?Gross per 24 hour  ?Intake 900 ml  ?Output --  ?Net 900 ml  ? ?Filed Weights  ? 12/06/21 0818  ?Weight: 71 kg  ? ? ?Examination: ? ?General exam: Appears calm and comfortable  ?Respiratory system: diminished breath sounds b/l  ?Cardiovascular system: S1 & S2+. No rubs, gallops or clicks. ?Gastrointestinal system: Abdomen is nondistended, soft and nontender. Hypoactive bowel sounds heard. ?Central nervous system: Alert and awake. Moves all extremities ?Psychiatry: Judgement and insight appears at baseline. Flat mood and affect  ? ? ?Data Reviewed: I have personally reviewed following labs and imaging studies ? ?CBC: ?Recent Labs  ?Lab 12/06/21 ?7681 12/07/21 ?0615  ?WBC 9.9 10.9*  ?NEUTROABS 7.6  --   ?HGB 15.8 12.8*  ?HCT 52.7* 42.6  ?MCV 86.4 85.9  ?PLT 171 139*  ? ?Basic Metabolic Panel: ?Recent Labs  ?Lab 12/06/21 ?1572 12/07/21 ?0615  ?NA 138 138  ?K 4.3 4.0  ?CL 96* 97*  ?CO2 33* 32  ?GLUCOSE 93 118*  ?BUN 19 25*  ?CREATININE 1.00 1.18  ?CALCIUM 9.0 8.8*  ?MG  --  2.1  ? ?GFR: ?Estimated Creatinine Clearance: 34.8 mL/min (by C-G formula based on SCr of 1.18 mg/dL). ?Liver Function Tests: ?No results  for input(s): AST, ALT, ALKPHOS, BILITOT, PROT, ALBUMIN in the last 168 hours. ?Recent Labs  ?Lab 12/06/21 ?6203  ?LIPASE 55*  ? ?No results for input(s): AMMONIA in the last 168 hours. ?Coagulation Profile: ?No results for input(s): INR, PROTIME in the last 168 hours. ?Cardiac Enzymes: ?No results for input(s): CKTOTAL, CKMB, CKMBINDEX, TROPONINI in the last 168 hours. ?BNP (last 3 results) ?No results for input(s): PROBNP in the last 8760 hours. ?HbA1C: ?No results for input(s): HGBA1C in the last 72 hours. ?CBG: ?No results for input(s): GLUCAP in the last 168 hours. ?Lipid Profile: ?No results for input(s): CHOL, HDL, LDLCALC, TRIG, CHOLHDL, LDLDIRECT in the last 72 hours. ?Thyroid Function Tests: ?No results for input(s): TSH, T4TOTAL, FREET4, T3FREE, THYROIDAB in the last 72 hours. ?Anemia Panel: ?No results for input(s): VITAMINB12, FOLATE, FERRITIN, TIBC, IRON, RETICCTPCT in the last 72 hours. ?Sepsis Labs: ?Recent Labs  ?  Lab 12/07/21 ?0615  ?PROCALCITON <0.10  ? ? ?Recent Results (from the past 240 hour(s))  ?Resp Panel by RT-PCR (Flu A&B, Covid) Nasopharyngeal Swab     Status: None  ? Collection Time: 12/07/21 12:17 AM  ? Specimen: Nasopharyngeal Swab; Nasopharyngeal(NP) swabs in vial transport medium  ?Result Value Ref Range Status  ? SARS Coronavirus 2 by RT PCR NEGATIVE NEGATIVE Final  ?  Comment: (NOTE) ?SARS-CoV-2 target nucleic acids are NOT DETECTED. ? ?The SARS-CoV-2 RNA is generally detectable in upper respiratory ?specimens during the acute phase of infection. The lowest ?concentration of SARS-CoV-2 viral copies this assay can detect is ?138 copies/mL. A negative result does not preclude SARS-Cov-2 ?infection and should not be used as the sole basis for treatment or ?other patient management decisions. A negative result may occur with  ?improper specimen collection/handling, submission of specimen other ?than nasopharyngeal swab, presence of viral mutation(s) within the ?areas targeted by this  assay, and inadequate number of viral ?copies(<138 copies/mL). A negative result must be combined with ?clinical observations, patient history, and epidemiological ?information. The expected result is Negative. ? ?F

## 2021-12-07 NOTE — Consult Note (Signed)
? ?Referring Physician:  ?No referring provider defined for this encounter. ? ?Primary Physician:  ?Rusty Aus, MD ? ?Chief Complaint:  falls ? ?History of Present Illness: ?12/07/2021 ?Harry Vargas is a 88 y.o. male who presents with the chief complaint of falls.  He has had multiple falls, and is currently in memory care.  He has dementia. ? ?He reports back pain but no other symptoms. ? ?Review of Systems:  ?A 10 point review of systems is negative, except for the pertinent positives and negatives detailed in the HPI. ? ?Past Medical History: ?Past Medical History:  ?Diagnosis Date  ? Alzheimer's disease ([place])   ? Arthritis   ? Asthma   ? B12 deficiency   ? Bleeding disorder ([place])   ? Coronary artery disease   ? Dysrhythmia   ? atrial flutter  ? GERD (gastroesophageal reflux disease)   ? Hyperlipidemia   ? Hypertension   ? NSVT (nonsustained ventricular tachycardia) ([place])   ? Prostate cancer ([place]) 1990  ? Takes Lupron shots  ? Sinus node dysfunction (HCC)   ? ? ?Past Surgical History: ?Past Surgical History:  ?Procedure Laterality Date  ? BYPASS GRAFT    ? CHOLECYSTECTOMY    ? CIRCUMCISION    ? COLONOSCOPY WITH PROPOFOL N/A 09/19/2019  ? Procedure: COLONOSCOPY WITH PROPOFOL;  Surgeon: Toledo, [name], MD;  Location: [facility] ENDOSCOPY;  Service: Gastroenterology;  Laterality: N/A;  COVID - 72 done at [facility], report on chart  ? CORONARY ARTERY BYPASS GRAFT    ? ESOPHAGOGASTRODUODENOSCOPY (EGD) WITH PROPOFOL N/A 09/19/2019  ? Procedure: ESOPHAGOGASTRODUODENOSCOPY (EGD) WITH PROPOFOL;  Surgeon: Toledo, [name], MD;  Location: [facility] ENDOSCOPY;  Service: Gastroenterology;  Laterality: N/A;  ? INSERT / REPLACE / REMOVE PACEMAKER    ? ? ?Allergies: ?Allergies as of 12/06/2021  ? (No Known Allergies)  ? ? ?Medications: ? ?Current Facility-Administered Medications:  ?  acetaminophen (TYLENOL) suppository 650 mg, 650 mg, Rectal, Q6H PRN, Ivor Costa, MD ?  acetaminophen (TYLENOL) tablet 650 mg, 650 mg, Oral, Q6H PRN, Ivor Costa, MD ?  albuterol (PROVENTIL) (2.5 MG/3ML) 0.083% nebulizer solution 2.5 mg, 2.5 mg, Nebulization, Q4H PRN, Ivor Costa, MD ?  calcium-vitamin D (OSCAL WITH D) 500-5 MG-MCG per tablet 1 tablet, 1 tablet, Oral, BID WC, Ivor Costa, MD, 1 tablet at 12/07/21 0815 ?  dextromethorphan-guaiFENesin (Espino DM) 30-600 MG per 12 hr tablet 1 tablet, 1 tablet, Oral, BID PRN, Ivor Costa, MD ?  donepezil (ARICEPT) tablet 5 mg, 5 mg, Oral, QHS, Ivor Costa, MD ?  iohexol (OMNIPAQUE) 350 MG/ML injection 75 mL, 75 mL, Intravenous, Once PRN, Ivor Costa, MD ?  ipratropium-albuterol (DUONEB) 0.5-2.5 (3) MG/3ML nebulizer solution 3 mL, 3 mL, Nebulization, Q4H, Ivor Costa, MD, 3 mL at 12/07/21 0816 ?  memantine (NAMENDA) tablet 10 mg, 10 mg, Oral, BID, Ivor Costa, MD ?  methocarbamol (ROBAXIN) tablet 500 mg, 500 mg, Oral, Q8H PRN, Ivor Costa, MD ?  midodrine (PROAMATINE) tablet 10 mg, 10 mg, Oral, TID with meals, Ivor Costa, MD, 10 mg at 12/07/21 0815 ?  multivitamin with minerals tablet 1 tablet, 1 tablet, Oral, Daily, Ivor Costa, MD ?  ondansetron (ZOFRAN) injection 4 mg, 4 mg, Intravenous, Q8H PRN, Ivor Costa, MD ?  oxyCODONE-acetaminophen (PERCOCET/ROXICET) 5-325 MG per tablet 1 tablet, 1 tablet, Oral, Q4H PRN, Ivor Costa, MD ?  pravastatin (PRAVACHOL) tablet 20 mg, 20 mg, Oral, QPM, Ivor Costa, MD ?  protein supplement (ENSURE MAX) liquid, 11 oz, Oral, BID, Ivor Costa,  MD ?  vitamin B-12 (CYANOCOBALAMIN) tablet 1,000 mcg, 1,000 mcg, Oral, Daily, Ivor Costa, MD ? ?Current Outpatient Medications:  ?  acetaminophen (TYLENOL) 650 MG CR tablet, Take 1,300 mg by mouth every 8 (eight) hours as needed. , Disp: , Rfl:  ?  Apple Cider Vinegar 600 MG CAPS, Take 600 mg by mouth 3 (three) times daily. , Disp: , Rfl:  ?  aspirin EC 81 MG tablet, Take 81 mg by mouth daily. , Disp: , Rfl:  ?  Calcium 600-200 MG-UNIT tablet, Take 1 tablet by mouth 2 (two) times daily with a meal., Disp: , Rfl:  ?  clopidogrel (PLAVIX) 75 MG tablet, Take 1 tablet  (75 mg total) by mouth daily., Disp: 30 tablet, Rfl: 1 ?  donepezil (ARICEPT) 5 MG tablet, Take 5 mg by mouth at bedtime., Disp: , Rfl:  ?  Ensure Max Protein (ENSURE MAX PROTEIN) LIQD, Take 330 mLs (11 oz total) by mouth 2 (two) times daily., Disp: 330 mL, Rfl:  ?  furosemide (LASIX) 20 MG tablet, Take 20 mg by mouth daily., Disp: , Rfl:  ?  ipratropium (ATROVENT) 0.06 % nasal spray, Place 1 spray into both nostrils 2 (two) times daily., Disp: , Rfl:  ?  Leuprolide Acetate, 6 Month, (LUPRON) 45 MG injection, Inject 45 mg into the muscle every 6 (six) months., Disp: , Rfl:  ?  memantine (NAMENDA) 10 MG tablet, Take 10 mg by mouth 2 (two) times daily., Disp: , Rfl:  ?  Multiple Vitamin (MULTIVITAMIN WITH MINERALS) TABS tablet, Take 1 tablet by mouth daily., Disp: , Rfl:  ?  Multiple Vitamins-Minerals (CENTRAL-VITE FOR SENIORS PO), Take by mouth., Disp: , Rfl:  ?  polyethylene glycol powder (GLYCOLAX/MIRALAX) powder, Take 17 g by mouth daily. , Disp: , Rfl:  ?  pravastatin (PRAVACHOL) 20 MG tablet, Take 20 mg by mouth every evening. , Disp: , Rfl:  ?  vitamin B-12 (CYANOCOBALAMIN) 500 MCG tablet, Take 1,000 mcg by mouth daily., Disp: , Rfl:  ? ? ?Social History: ?Social History  ? ?Tobacco Use  ? Smoking status: Never  ? Smokeless tobacco: Never  ?Vaping Use  ? Vaping Use: Never used  ?Substance Use Topics  ? Alcohol use: No  ? Drug use: No  ? ? ?Family Medical History: ?Family History  ?Problem Relation Age of Onset  ? Prostate cancer Neg Hx   ? Bladder Cancer Neg Hx   ? Kidney cancer Neg Hx   ? ? ?Physical Examination: ?Vitals:  ? 12/07/21 0700 12/07/21 0800  ?BP: (!) 103/91 (!) 108/92  ?Pulse:    ?Resp: (!) 27   ?Temp:    ?SpO2:    ? ? ? ?General: Patient is well developed, well nourished, calm, collected, and in no apparent distress. ? ?Psychiatric: Patient is non-anxious. ? ?Head:  Pupils equal, round, and reactive to light. ? ?ENT:  Oral mucosa appears well hydrated. ? ?Neck:   Supple.  Full range of  motion. ? ?Respiratory: Patient is breathing without any difficulty. ? ?Extremities: No edema. ? ?Vascular: Palpable pulses in dorsal pedal vessels. ? ?Skin:   On exposed skin, there are no abnormal skin lesions. ? ?NEUROLOGICAL:  ?General: In no acute distress.   ?Awake, alert, oriented to person, place, and "2022."  Pupils equal round and reactive to light.  Facial tone is symmetric.  Tongue protrusion is midline.   ? ?Strength: ?Side Biceps Triceps Deltoid Interossei Grip Wrist Ext. Wrist Flex.  ?R '5 5 5 5 '$ 5  5 5  ?L '5 5 5 5 5 5 5  '$ ? ?Side Iliopsoas Quads Hamstring PF DF EHL  ?R '5 5 5 5 5 5  '$ ?L '5 5 5 5 5 5  '$ ? ? ?Bilateral upper and lower extremity sensation is intact to light touch. ?Reflexes are 1+ and symmetric at the biceps, triceps, brachioradialis, patella and achilles. Hoffman's is absent. ? ?Clonus is not present.  Toes are down-going.   ? ?Gait is untested. ? ?Imaging: ?CT TL spine 12/06/2021 ?IMPRESSION: ?1. No acute traumatic injury in the chest, abdomen, or pelvis. ?2. Age indeterminate compression deformity at T12 is new since prior ?CT of 2021. Please see report for dedicated thoracolumbar spine CT ?for more definitive characterization. ?3. Moderate hiatal hernia. ?4. Left colonic diverticulosis without diverticulitis. ?5. 6 mm right upper lobe nodule stable since 12/07/2018, consistent ?with benign etiology. No follow-up indicated. ?6. Aortic Atherosclerosis (ICD10-I70.0). ?  ?  ?Electronically Signed ?  By: Misty Stanley M.D. ?  On: 12/06/2021 12:59 ? ?I have personally reviewed the images and agree with the above interpretation. ? ?Labs: ? ?  Latest Ref Rng & Units 12/07/2021  ?  6:15 AM 12/06/2021  ?  8:24 AM 10/19/2021  ?  6:44 AM  ?CBC  ?WBC 4.0 - 10.5 K/uL 10.9   9.9   11.6    ?Hemoglobin 13.0 - 17.0 g/dL 12.8   15.8   14.2    ?Hematocrit 39.0 - 52.0 % 42.6   52.7   47.1    ?Platelets 150 - 400 K/uL 139   171   171    ? ? ?Assessment and Plan: ?Mr. Hemenway is a pleasant 88 y.o. male with mild  compression fractures with underlying issue of dementia and failure to thrive. ? ?- No brace necessary ?- Consider palliative care per patient request ?- Consider placement in a higher level of supervision ? ? ? ?[place]

## 2021-12-07 NOTE — Progress Notes (Signed)
? ?NAME:  Harry Vargas, MRN:  [id number], DOB:  [birth year], LOS: 1 ?ADMISSION DATE:  12/06/2021, CONSULTATION DATE:  12/06/21 ?REFERRING MD: Ivor Costa, MD CHIEF COMPLAINT:  Respiratory Failure  ? ?History of Present Illness:  ?Harry Vargas is 88 year old male with coronary artery disease on plavix, alzheimer's dementia and combined systolic and diastolic heart failure who is admitted for acute hypoxemic respiratory failure requiring bipap in the ER. He suffered a fall at [facility] facility where he slipped backwards and hit his head without loss of consciousness. Radiology evaluation shows compression fracture at T12 and possible compression deformities at T8 and T11. CT Chest abdomen and pelvis showed interstitial markings with chronic features, stable RUL nodule and airspace disease in bilateral bases. No acute findings of the abdomen.  ? ?PCCM has been consulted for further evaluation of respiratory failure. ? ?I spoke with patient's daughter Jackelyn Poling at the bedside. Patient has had decreasing mental status since arrival to the ER. He is tolerating bipap well at this time. Per daughter, patient has expressed wishes to go to hospice over recent days/weeks. She is ok with trial of bipap and vasopressor support if needed.  ? ?Pertinent  Medical History  ?Coronary Artery Disease ?Pacemaker ?Alzheimer's Dementia ?Systolic and Diastolic CHF ?Hypertension ?Dysphagia ? ?Significant Hospital Events: ?Including procedures, antibiotic start and stop dates in addition to other pertinent events   ?5/8 admitted, requiring Bipap therapy ? ?Interim History / Subjective:  ? ?No acute events overnight. Patient weaned off bipap. ? ?He is now comfort care as palliative care and hospitalist services spoke with daughter. ? ?Objective   ?Blood pressure 135/73, pulse 68, temperature 97.6 ?F (36.4 ?C), temperature source Oral, resp. rate (!) 27, height '5\' 4"'$  (1.626 m), weight 71 kg, SpO2 98 %. ?   ?   ? ?Intake/Output Summary  (Last 24 hours) at 12/07/2021 0939 ?Last data filed at 12/06/2021 1913 ?Gross per 24 hour  ?Intake 900 ml  ?Output --  ?Net 900 ml  ? ?Filed Weights  ? 12/06/21 0818  ?Weight: 71 kg  ? ? ?Examination: ?General: elderly male, no acute distress ?HENT: hematoma right posterior head, moist mucous membranes, sclera anicteric ?Lungs: clear to auscultation. No wheezing ?Cardiovascular: rrr, no murmurs ?Abdomen: soft, non-distended, non-tender, BS+ ?Extremities: warm, no edema ?Neuro: more awake/alert today, moving all extremities ?GU: n/a ? ?Resolved Hospital Problem list   ? ? ?Assessment & Plan:  ?Acute Hypoxemic and Hypercapnic Respiratory Failure  ?In setting of recent fall with head trauma leading to decline in neurologic status along with chronic interstitial changes concerning for mild underlying ILD/Fibrosis ?- Patient is DNR/DNI ?- would not recommend further workup/evaluation of suspected ILD given he did not require O2 prior to admission ?- Procalcitonin is low. Low concern for pneumonia at this time. ?- Low concern for PE at this time ? ?Acute Encephalopathy ?Possible traumatic brain injury due to fall from standing ?Alzheimer's Dementia ?Hypercapnia as above ?- repeat CT head with out intracranial pathology noted ?- mental status somewhat improved today ? ?Chronic Systolic and Diastolic Heart Failure ?- BNP not elevated, less concern for exacerbation at this time ?- continue to monitor ? ?Hypotension - resolved ?- continue to monitor ? ?PCCM will sign off. He is now comfort care. ? ?Best Practice (right click and "Reselect all SmartList Selections" daily)  ? ?Per primary ? ?Labs   ?CBC: ?Recent Labs  ?Lab 12/06/21 ?4098 12/07/21 ?0615  ?WBC 9.9 10.9*  ?NEUTROABS 7.6  --   ?  HGB 15.8 12.8*  ?HCT 52.7* 42.6  ?MCV 86.4 85.9  ?PLT 171 139*  ? ? ?Basic Metabolic Panel: ?Recent Labs  ?Lab 12/06/21 ?9390 12/07/21 ?0615  ?NA 138 138  ?K 4.3 4.0  ?CL 96* 97*  ?CO2 33* 32  ?GLUCOSE 93 118*  ?BUN 19 25*  ?CREATININE 1.00 1.18   ?CALCIUM 9.0 8.8*  ?MG  --  2.1  ? ?GFR: ?Estimated Creatinine Clearance: 34.8 mL/min (by C-G formula based on SCr of 1.18 mg/dL). ?Recent Labs  ?Lab 12/06/21 ?3009 12/07/21 ?0615  ?PROCALCITON  --  <0.10  ?WBC 9.9 10.9*  ? ? ?Liver Function Tests: ?No results for input(s): AST, ALT, ALKPHOS, BILITOT, PROT, ALBUMIN in the last 168 hours. ?Recent Labs  ?Lab 12/06/21 ?2330  ?LIPASE 55*  ? ?No results for input(s): AMMONIA in the last 168 hours. ? ?ABG ?   ?Component Value Date/Time  ? PHART 7.37 12/06/2021 1412  ? PCO2ART 62 (H) 12/06/2021 1412  ? PO2ART 90 12/06/2021 1412  ? HCO3 35.8 (H) 12/06/2021 1412  ? O2SAT 98.2 12/06/2021 1412  ?  ? ?Coagulation Profile: ?No results for input(s): INR, PROTIME in the last 168 hours. ? ?Cardiac Enzymes: ?No results for input(s): CKTOTAL, CKMB, CKMBINDEX, TROPONINI in the last 168 hours. ? ?HbA1C: ?Hgb A1c MFr Bld  ?Date/Time Value Ref Range Status  ?10/16/2021 03:38 PM 5.6 4.8 - 5.6 % Final  ?  Comment:  ?  (NOTE) ?        Prediabetes: 5.7 - 6.4 ?        Diabetes: >6.4 ?        Glycemic control for adults with diabetes: <7.0 ?  ?10/16/2021 02:17 PM 5.5 4.8 - 5.6 % Final  ?  Comment:  ?  (NOTE) ?        Prediabetes: 5.7 - 6.4 ?        Diabetes: >6.4 ?        Glycemic control for adults with diabetes: <7.0 ?  ? ? ?CBG: ?No results for input(s): GLUCAP in the last 168 hours. ? ? ?Critical care time: n/a ?  ? ?Freda Jackson, MD ?[facility] ?Office: [phone] ? ? ?See Amion for personal pager ?PCCM on call pager [phone] until 7pm. ?Please call Elink 7p-7a. [phone] ? ? ?  ?

## 2021-12-08 DIAGNOSIS — J9601 Acute respiratory failure with hypoxia: Secondary | ICD-10-CM | POA: Diagnosis not present

## 2021-12-08 DIAGNOSIS — Z515 Encounter for palliative care: Secondary | ICD-10-CM

## 2021-12-08 NOTE — Assessment & Plan Note (Signed)
--  decided by family after discussion on 5/9 ?

## 2021-12-08 NOTE — Progress Notes (Signed)
?  Progress Note ? ? ?Patient: Harry Vargas MGQ:[id number] DOB: August 17, 1928 DOA: 12/06/2021     2 ?DOS: the patient was seen and examined on 12/08/2021 ?  ?Brief hospital course: ?No notes on file ? ?Assessment and Plan: ?* Acute respiratory failure with hypoxia (HCC) ?Patient has acute respiratory failure with hypoxia.  Currently on BiPAP.  Etiology is not clear.  Consulted to Dr. Erin Fulling of ICU.  Per Dr. Erin Fulling, patient has chronic interstitial changes concerning for mild underlying ILD/Fibrosis.  He has low suspicions of PE, therefore canceled CT angiogram. ?--pt now comfort care.  Supplemental oxygen removed, O2 sats mid 80s.  No signs of respiratory distress currently. ? ? ? ? ?Benign essential hypertension ?- Hold blood pressure medications due to hypotension ? ?Comfort measures only status ?--decided by family after discussion on 5/9 ? ?Hypotension ?Etiology is not clear.  No signs of infection, does not seem to have sepsis ? ? ?Acute metabolic encephalopathy ?Likely due to multifactorial etiology, including concussion and hypoxia. ?- Frequent neurochecks ?-Repeat CT head in AM  ? ?CAD (coronary artery disease) ?- d/c'ed aspirin, Plavix and pravastatin due to comfort care status ? ?Prostate cancer ([place]) ?- Patient is on Lupron injection every 6 months ? ?Alzheimer's disease ([place]) ?- d/c'ed home donepezil, Namenda due to comfort care status ? ?Chronic diastolic CHF (congestive heart failure) ([place]) ?2D echo on 03/03/2020 showed EF of> 55%.  Patient has trace leg edema, BNP is 121, does not seem to have CHF exacerbation. ?-Hold Lasix due to hypotension ? ?Compression fracture of thoracic vertebra (HCC) ?-consulted Dr. Cari Caraway of of neurosurgery, no need for brace ?-Pain control: As needed Percocet, Tylenol and Robaxin ? ?Fall ?- Fall precaution ?--no PT/OT due to comfort care status ? ?Hyperlipidemia, mixed ?--d/c statin due to comfort care status ? ? ? ? ?  ? ?Subjective:  ?Pt has been calm.   ? ? ?Physical  Exam: ? ?Constitutional: NAD, sleepy, no oriented ?HEENT: conjunctivae and lids normal, EOMI ?CV: No cyanosis.   ?RESP: normal respiratory effort, on RA ?Extremities: No effusions, edema in BLE ?SKIN: warm, dry ? ? ?Data Reviewed: ? ?Family Communication: daughter updated at bedside today ? ?Disposition: ?Status is: Inpatient ? ? Planned Discharge Destination: Skilled nursing facility ? ? ? ?Time spent: 50 minutes ? ?Author: ?Enzo Bi, MD ?12/08/2021 9:42 PM ? ?For on call review [email or web address].  ?

## 2021-12-08 NOTE — Plan of Care (Signed)
Remains comfort care.  ? ?Problem: Education: ?Goal: Ability to demonstrate management of disease process will improve ?Outcome: Not Progressing ?Goal: Ability to verbalize understanding of medication therapies will improve ?Outcome: Not Progressing ?Goal: Individualized Educational Video(s) ?Outcome: Not Progressing ?  ?Problem: Activity: ?Goal: Capacity to carry out activities will improve ?Outcome: Not Progressing ?  ?Problem: Cardiac: ?Goal: Ability to achieve and maintain adequate cardiopulmonary perfusion will improve ?Outcome: Not Progressing ?  ?

## 2021-12-09 DIAGNOSIS — J9601 Acute respiratory failure with hypoxia: Secondary | ICD-10-CM | POA: Diagnosis not present

## 2021-12-09 DIAGNOSIS — Z7189 Other specified counseling: Secondary | ICD-10-CM | POA: Diagnosis not present

## 2021-12-09 NOTE — Care Management Important Message (Signed)
Important Message ? ?Patient Details  ?Name: Harry Vargas ?MRN: [id number] ?Date of Birth: March 08, 1929 ? ? ?Medicare Important Message Given:  Other (see comment) ? ?Patient is on comfort care and will return to [facility] with plans to engage Hospice. Out of respect for the patient and family no Important Message from [facility] given. ? ? ?Juliann Pulse A Jamisyn Langer ?12/09/2021, 8:44 AM ?

## 2021-12-09 NOTE — Progress Notes (Signed)
?  Progress Note ? ? ?Patient: Harry Vargas OMA:[id number] DOB: [birth year] DOA: 12/06/2021     3 ?DOS: the patient was seen and examined on 12/09/2021 ?  ?Brief hospital course: ?No notes on file ? ?Assessment and Plan: ?* Acute respiratory failure with hypoxia (HCC) ?Patient has acute respiratory failure with hypoxia.  Currently on BiPAP.  Etiology is not clear.  Consulted to Dr. Erin Fulling of ICU.  Per Dr. Erin Fulling, patient has chronic interstitial changes concerning for mild underlying ILD/Fibrosis.  He has low suspicions of PE, therefore canceled CT angiogram. ?--pt now comfort care.  Supplemental oxygen removed, O2 sats mid 80s.  No signs of respiratory distress currently. ? ? ? ? ?Benign essential hypertension ?- Hold blood pressure medications due to hypotension ? ?Comfort measures only status ?--decided by family after discussion on 5/9 ? ?Hypotension ?Etiology is not clear.  No signs of infection, does not seem to have sepsis ? ? ?Acute metabolic encephalopathy ?Likely due to multifactorial etiology, including concussion and hypoxia. ? ?CAD (coronary artery disease) ?- d/c'ed aspirin, Plavix and pravastatin due to comfort care status ? ?Prostate cancer ([place]) ?- Patient was on Lupron injection every 6 months ? ?Alzheimer's disease ([place]) ?- d/c'ed home donepezil, Namenda due to comfort care status ? ?Chronic diastolic CHF (congestive heart failure) ([place]) ?2D echo on 03/03/2020 showed EF of> 55%.  Patient has trace leg edema, BNP is 121, does not seem to have CHF exacerbation. ?-Hold Lasix due to hypotension ? ?Compression fracture of thoracic vertebra (HCC) ?-consulted Dr. Cari Caraway of of neurosurgery, no need for brace ? ?Fall ?- Fall precaution ?--no PT/OT due to comfort care status ? ?Cardiac pacemaker in situ ?--may need to turn it off ? ?Hyperlipidemia, mixed ?--d/c'ed statin due to comfort care status ? ? ? ? ?  ? ?Subjective:  ?Hospice liaison met with daughter today.   ? ? ?Physical Exam: ? ?Constitutional:  NAD, lethargic, not oriented ?HEENT: conjunctivae and lids normal, EOMI ?CV: No cyanosis.   ?RESP: normal respiratory effort, on RA ? ? ?Data Reviewed: ? ?Family Communication: daughter updated at bedside today ? ?Disposition: ?Status is: Inpatient ? ? Planned Discharge Destination: Skilled nursing facility ? ? ? ?Time spent: 50 minutes ? ?Author: ?Enzo Bi, MD ?12/09/2021 6:21 PM ? ?For on call review [email or web address]  ?

## 2021-12-09 NOTE — Assessment & Plan Note (Signed)
--  may need to turn it off ?

## 2021-12-09 NOTE — Progress Notes (Signed)
[facility]) Hospital Liaison Note ?  ?Received request from Transitions of Care Manager, Jiles Garter, for hospice services at home after discharge. Chart and patient information under review by [facility] physician. Hospice eligibility approved. ?  ?Spoke with daughter/Debbie to initiate education related to hospice philosophy, services, and team approach to care. Debbie verbalized understanding of information given. Per discussion, the plan is for patient to discharge home via [facility] once cleared to DC.  ? ?Patient to discharge to [facility] once medically clear. ?  ?Please send signed and completed DNR home with patient/family. Please provide prescriptions at discharge as needed to ensure ongoing symptom management.  ?  ?[facility] information and contact numbers given to family & above information shared with TOC. ?  ?Please call with any questions/concerns.  ?  ?Thank you for the opportunity to participate in this patient's care. ?  ?Daphene Calamity, MSW ?[place]  ?[phone] ? ?

## 2021-12-09 NOTE — Care Management (Signed)
Patient is from [facility] ?

## 2021-12-09 NOTE — Progress Notes (Addendum)
? ?                                                                                                                                                     ?                                                   ?Daily Progress Note  ? ?Patient Name: Harry Vargas       Date: 12/09/2021 ?DOB: 03-Dec-1928  Age: 88 y.o. MRN#: [id number] ?Attending Physician: Enzo Bi, MD ?Primary Care Physician: Rusty Aus, MD ?Admit Date: 12/06/2021 ? ?Reason for Consultation/Follow-up: Establishing goals of care ? ?Subjective: ?Notes reviewed. In to see patient, daughter is at bedside. She states her father has LTC approval and will be returning to [facility] under LTC. Discussed his status and O2 saturation. She states he is "in and out" and sleeping a lot. She tells me he is not eating sustainably.   ? ?We discussed current plans for return to [facility] on comfort care with hospice to follow. She wants to ensure he is receiving comfort care and not rehab. Discussed that [facility] would be following and only a phone call away if needed.  ? ?Length of Stay: 3 ? ?Current Medications: ?Scheduled Meds:  ? Ensure Max Protein  11 oz Oral BID  ? ? ?Continuous Infusions: ? ? ?PRN Meds: ?acetaminophen, acetaminophen, glycopyrrolate **OR** glycopyrrolate **OR** glycopyrrolate, iohexol, ipratropium-albuterol, methocarbamol, morphine injection, morphine CONCENTRATE **OR** morphine CONCENTRATE, ondansetron (ZOFRAN) IV, oxyCODONE-acetaminophen ? ?Physical Exam ?Pulmonary:  ?   Effort: Pulmonary effort is normal.  ?Neurological:  ?   Mental Status: He is alert.  ?         ? ?Vital Signs: BP 104/60 (BP Location: Right Arm)   Pulse 90   Temp 99.9 ?F (37.7 ?C)   Resp (!) 21   Ht '5\' 4"'$  (1.626 m)   Wt 71 kg   SpO2 (!) 86%   BMI 26.87 kg/m?  ?SpO2: SpO2: (!) 86 % ?O2 Device: O2 Device: Room Air ?O2 Flow Rate: O2 Flow Rate (L/min): 2 L/min ? ?Intake/output summary:  ?Intake/Output Summary (Last 24 hours) at 12/09/2021 1342 ?Last data filed at 12/09/2021  1937 ?Gross per 24 hour  ?Intake 714 ml  ?Output 200 ml  ?Net 514 ml  ? ?LBM: Last BM Date :  (unknown) ?Baseline Weight: Weight: 71 kg ?Most recent weight: Weight: 71 kg ? ? ?Patient Active Problem List  ? Diagnosis Date Noted  ? Comfort measures only status 12/08/2021  ? Respiratory failure ([place]) 12/07/2021  ? Fall 12/06/2021  ? Acute respiratory failure with hypoxia ([place]) 12/06/2021  ? Compression fracture of thoracic vertebra ([place]) 12/06/2021  ?  Chronic diastolic CHF (congestive heart failure) ([place]) 12/06/2021  ? Alzheimer's disease ([place])   ? Dysphagia 10/21/2021  ? Overweight (BMI 25.0-29.9) 10/20/2021  ? Chronic combined systolic (congestive) and diastolic (congestive) heart failure ([place]) 10/20/2021  ? Malnutrition of moderate degree 10/19/2021  ? Delirium 10/18/2021  ? Neck pain 10/18/2021  ? Somnolence 10/17/2021  ? Falls 10/16/2021  ? 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 Alzheimer's dementia with behavioral disturbance ([place]) 09/05/2019  ? Atrial flutter ([place]) 01/10/2018  ? Cardiac pacemaker in situ 10/26/2017  ? Benign essential hypertension 09/27/2016  ? 3-vessel CAD 07/06/2016  ? Cancer of prostate ([place]) 07/06/2016  ? Hyperlipidemia, mixed 07/06/2016  ? Prostate cancer ([place]) 1990  ? CAD (coronary artery disease) 1990  ? Atrial fibrillation, chronic ([place]) 1990  ? Acute metabolic encephalopathy 3300  ? Hypotension 1990  ? ? ?Palliative Care Assessment & Plan  ? ? ?Recommendations/Plan: ? ?If LTC is in place, recommend returning to [facility] on full comfort care with hospice to follow.  ? ? ?Code Status: ? ?  ?Code Status Orders  ?(From admission, onward)  ?  ? ? ?  ? ?  Start     Ordered  ? 12/07/21 1204  Do not attempt resuscitation (DNR)  Continuous       ?Question Answer Comment  ?In the event of cardiac or respiratory ARREST Do not call a ?code blue?   ?In the event of cardiac or respiratory ARREST Do not perform Intubation, CPR, defibrillation or ACLS   ?In the event of cardiac or  respiratory ARREST Use medication by any route, position, wound care, and other measures to relive pain and suffering. May use oxygen, suction and manual treatment of airway obstruction as needed for comfort.   ?  ? 12/07/21 1203  ? ?  ?  ? ?  ? ?Code Status History   ? ? Date Active Date Inactive Code Status Order ID Comments User Context  ? 12/06/2021 1507 12/07/2021 1203 DNR [id number]  Ivor Costa, MD ED  ? 10/16/2021 1843 10/21/2021 2213 DNR [id number]  Para Skeans, MD ED  ? ?  ? ? ?Prognosis: ? < 6 months ? ? ? ?Care plan was discussed with [facility] liaison.  ? ?Thank you for allowing the Palliative Medicine Team to assist in the care of this patient. ? ? ?Asencion Gowda, NP ? ?Please contact Palliative Medicine Team phone at [phone] for questions and concerns.  ? ? ? ? ? ?

## 2021-12-10 DIAGNOSIS — J9601 Acute respiratory failure with hypoxia: Secondary | ICD-10-CM | POA: Diagnosis not present

## 2021-12-10 MED ORDER — OXYCODONE-ACETAMINOPHEN 5-325 MG PO TABS: 1.0000 | ORAL_TABLET | ORAL | 0 refills | Status: DC | PRN

## 2021-12-10 NOTE — [place] FL2 (Signed)
?[place] MEDICAID FL2 LEVEL OF CARE SCREENING TOOL  ?  ? ?IDENTIFICATION  ?Patient Name: ?Harry Vargas Birthdate: 1929/04/28 Sex: male Admission Date (Current Location): ?12/06/2021  ?[place] and [place] Number: ? [place] ?  Facility and Address:  ?[facility], [address] [place], [place] [address] ?     Provider Number: ?[id number]  ?Attending Physician Name and Address:  ?Enzo Bi, MD ? Relative Name and Phone Number:  ?[name]- daughter- [phone] ?   ?Current Level of Care: ?Hospital Recommended Level of Care: ?[place] Prior Approval Number: ?  ? ?Date Approved/Denied: ?  PASRR Number: ?[id number] A ? ?Discharge Plan: ?SNF ?  ? ?Current Diagnoses: ?Patient Active Problem List  ? Diagnosis Date Noted  ? Comfort measures only status 12/08/2021  ? Respiratory failure ([place]) 12/07/2021  ? Fall 12/06/2021  ? Acute respiratory failure with hypoxia ([place]) 12/06/2021  ? Compression fracture of thoracic vertebra ([place]) 12/06/2021  ? Chronic diastolic CHF (congestive heart failure) ([place]) 12/06/2021  ? Alzheimer's disease ([place])   ? Dysphagia 10/21/2021  ? Overweight (BMI 25.0-29.9) 10/20/2021  ? Chronic combined systolic (congestive) and diastolic (congestive) heart failure ([place]) 10/20/2021  ? Malnutrition of moderate degree 10/19/2021  ? Delirium 10/18/2021  ? Neck pain 10/18/2021  ? Somnolence 10/17/2021  ? Falls 10/16/2021  ? 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 Alzheimer's dementia with behavioral disturbance ([place]) 09/05/2019  ? Atrial flutter ([place]) 01/10/2018  ? Cardiac pacemaker in situ 10/26/2017  ? Benign essential hypertension 09/27/2016  ? 3-vessel CAD 07/06/2016  ? Cancer of prostate ([place]) 07/06/2016  ? Hyperlipidemia, mixed 07/06/2016  ? Prostate cancer ([place]) 1990  ? CAD (coronary artery disease) 1990  ? Atrial fibrillation, chronic ([place]) 1990  ? Acute metabolic encephalopathy 6433  ? Hypotension 1990  ? ? ?Orientation RESPIRATION BLADDER  Height & Weight   ?  ?Self ? Normal Incontinent Weight: 71 kg ?Height:  '5\' 4"'$  (162.6 cm)  ?BEHAVIORAL SYMPTOMS/MOOD NEUROLOGICAL BOWEL NUTRITION STATUS  ?    Continent Diet (Pureed regular diet- patient prefers Mashed Potatoes, applesauce and Ensure)  ?AMBULATORY STATUS COMMUNICATION OF NEEDS Skin   ?Extensive Assist Verbally Normal ?  ?  ?  ?    ?     ?     ? ? ?Personal Care Assistance Level of Assistance  ?Bathing, Feeding, Dressing Bathing Assistance: Maximum assistance ?Feeding assistance: Limited assistance ?Dressing Assistance: Maximum assistance ?   ? ?Functional Limitations Info  ?Sight, Hearing, Speech Sight Info: Adequate ?Hearing Info: Adequate ?Speech Info: Adequate  ? ? ?SPECIAL CARE FACTORS FREQUENCY  ?PT (By licensed PT), OT (By licensed OT)   ?  ?PT Frequency: eval at facility ?OT Frequency: eval at facility ?  ?  ?  ?   ? ? ?Contractures Contractures Info: Not present  ? ? ?Additional Factors Info  ?Code Status, Allergies Code Status Info: DNR ?Allergies Info: NKA ?  ?  ?  ?   ? ?Current Medications (12/10/2021):  This is the current hospital active medication list ?Current Facility-Administered Medications  ?Medication Dose Route Frequency Provider Last Rate Last Admin  ? acetaminophen (TYLENOL) suppository 650 mg  650 mg Rectal Q6H PRN Ivor Costa, MD      ? acetaminophen (TYLENOL) tablet 650 mg  650 mg Oral Q6H PRN Ivor Costa, MD   650 mg at 12/07/21 2057  ? glycopyrrolate (ROBINUL) tablet 1 mg  1 mg Oral Q4H PRN Wyvonnia Dusky, MD      ?  Or  ? glycopyrrolate (ROBINUL) injection 0.2 mg  0.2 mg Subcutaneous Q4H PRN Wyvonnia Dusky, MD      ? Or  ? glycopyrrolate (ROBINUL) injection 0.2 mg  0.2 mg Intravenous Q4H PRN Wyvonnia Dusky, MD      ? iohexol (OMNIPAQUE) 350 MG/ML injection 75 mL  75 mL Intravenous Once PRN Ivor Costa, MD      ? ipratropium-albuterol (DUONEB) 0.5-2.5 (3) MG/3ML nebulizer solution 3 mL  3 mL Nebulization Q6H PRN Wyvonnia Dusky, MD      ? methocarbamol  (ROBAXIN) tablet 500 mg  500 mg Oral Q8H PRN Ivor Costa, MD   500 mg at 12/07/21 2101  ? morphine (PF) 2 MG/ML injection 1 mg  1 mg Intravenous Q2H PRN Wyvonnia Dusky, MD   1 mg at 12/09/21 6948  ? morphine CONCENTRATE 10 MG/0.5ML oral solution 5 mg  5 mg Oral Q2H PRN Wyvonnia Dusky, MD   5 mg at 12/09/21 1723  ? Or  ? morphine CONCENTRATE 10 MG/0.5ML oral solution 5 mg  5 mg Sublingual Q2H PRN Wyvonnia Dusky, MD      ? ondansetron [facility]) injection 4 mg  4 mg Intravenous Q8H PRN Ivor Costa, MD      ? oxyCODONE-acetaminophen (PERCOCET/ROXICET) 5-325 MG per tablet 1 tablet  1 tablet Oral Q4H PRN Ivor Costa, MD   1 tablet at 12/09/21 5462  ? ? ? ?Discharge Medications: ?Please see discharge summary for a list of discharge medications. ? ?Relevant Imaging Results: ? ?Relevant Lab Results: ? ? ?Additional Information ?SS# [id number] ? ?Shelbie Hutching, RN ? ? ? ? ?

## 2021-12-10 NOTE — Progress Notes (Signed)
[facility]) Hospital Liaison Note ? ?MSW notified by Rudi Rummage that patient/family has chosen to return to [facility] with palliative ACC services vs hospice services. [facility] staff notified. ? ?[facility] information and contact numbers given to family & above information shared with TOC.  ?  ?Please call with any questions/concerns.  ?  ?Thank you for the opportunity to participate in this patient's care. ?  ?Daphene Calamity, MSW ?[place]  ?[phone] ?

## 2021-12-10 NOTE — Discharge Summary (Addendum)
Physician Discharge Summary   Harry Vargas  male DOB: 1929-02-10  BMW:[id number]  PCP: Rusty Aus, MD  Admit date: 12/06/2021 Discharge date: 12/10/2021  Admitted From: SNF Disposition:  SNF with palliative care Daughter updated at bedside prior to discharge.  CODE STATUS: DNR   Hospital Course:  For full details, please see H&P, progress notes, consult notes and ancillary notes.  Briefly,  Harry Vargas is a 88 y.o. male with medical history significant of hypertension, asthma, sinoatrial node dysfunction, s/p pacemaker placement, prostate cancer, CAD, CABG, atrial fibrillation not on anticoagulants, Alzheimer's disease, dCHF, who presented from SNF with fall, back pain and shortness breath, altered mental status.   Per patient's daughter at the bedside, patient fell after he slipped backwards and hit his head. No LOC.  Patient complains of back pain.  No headache or neck pain.  Patient is more confused than baseline per her daughter.  Patient was found to have shortness of breath, severe respiratory distress, and oxygen desaturation to 85% on room air.  BiPAP started in ED. Normally patient not using oxygen.  Patient does not have active cough, chest pain.   No acute findings of the CT a/b/p. CT-of T spin shows compression fracture at T12 and possible compression deformities at T8 and T11.  CT of the head and C-spine is negative for acute injury, showed a right posterior head hematoma.  Patient is admitted to stepdown as inpatient.    * Acute respiratory failure with hypoxia (HCC) Etiology is not clear.  Consulted to Dr. Erin Fulling of ICU.  Per Dr. Erin Fulling, patient has chronic interstitial changes concerning for mild underlying ILD/Fibrosis.  He has low suspicions of PE, therefore canceled CT angiogram. --Pt did not want any life sustaining treatment.  Supplemental oxygen removed, O2 sats mid 80s.  No signs of respiratory distress currently.   Benign essential hypertension - Hold  blood pressure medications due to hypotension   Goals of care discussion --daughter said pt clearly stated he didn't want any life-prolonging or sustaining measures.  Focused on comfort care.     Hypotension Etiology is not clear.  No signs of infection, does not seem to have sepsis   Acute metabolic encephalopathy Baseline Alzheimer's dementia Likely due to multifactorial etiology, including concussion and hypoxia.   CAD (coronary artery disease) - d/c'ed aspirin, Plavix and pravastatin due to comfort care status   Prostate cancer [facility]) - Patient was on Lupron injection every 6 months.  No need to continue   Alzheimer's disease ([place]) - d/c'ed home donepezil, Namenda due to comfort care status   Hx of combined CHF (congestive heart failure) ([place]) Chronic diastolic CHF 2D echo on 2/72/53 showed EF 35 to 40%.  However, Echo during current admission showed LVEF 50-55% with G1 DD. Patient has trace leg edema, BNP is 121, does not seem to have CHF exacerbation. -Hold Lasix due to hypotension   Compression fracture of thoracic vertebra [facility]) -consulted Dr. Cari Caraway of of neurosurgery, no need for brace   Fall - Fall precaution --no PT/OT due to comfort care status   Cardiac pacemaker in situ --placed by [facility] cardiologist.  Checked with our cardiology oncall, cardiac pacemaker does not need to be turned off and can not be turned off due to ethical issues.     Hyperlipidemia, mixed --d/c'ed statin due to comfort care status    Discharge Diagnoses:  Principal Problem:   Acute respiratory failure with hypoxia (HCC) Active Problems:   Benign essential hypertension  Hyperlipidemia, mixed   Cardiac pacemaker in situ   Fall   Compression fracture of thoracic vertebra (HCC)   Alzheimer's disease ([place])   Prostate cancer ([place])   CAD (coronary artery disease)   Atrial fibrillation, chronic (HCC)   Acute metabolic encephalopathy   Hypotension   Respiratory failure (HCC)    Comfort measures only status   30 Day Unplanned Readmission Risk Score    Flowsheet Row ED to Hosp-Admission (Current) from 12/06/2021 in [place]  30 Day Unplanned Readmission Risk Score (%) 16.87 Filed at 12/10/2021 1200       This score is the patient's risk of an unplanned readmission within 30 days of being discharged (0 -100%). The score is based on dignosis, age, lab data, medications, orders, and past utilization.   Low:  0-14.9   Medium: 15-21.9   High: 22-29.9   Extreme: 30 and above         Discharge Instructions:  Allergies as of 12/10/2021   No Known Allergies      Medication List     STOP taking these medications    Apple Cider Vinegar 600 MG Caps   aspirin EC 81 MG tablet   Calcium 600-200 MG-UNIT tablet   CENTRAL-VITE FOR SENIORS PO   clopidogrel 75 MG tablet Commonly known as: PLAVIX   donepezil 5 MG tablet Commonly known as: ARICEPT   Ensure Max Protein Liqd   furosemide 20 MG tablet Commonly known as: LASIX   ipratropium 0.06 % nasal spray Commonly known as: ATROVENT   Leuprolide Acetate (6 Month) 45 MG injection Commonly known as: LUPRON   memantine 10 MG tablet Commonly known as: NAMENDA   multivitamin with minerals Tabs tablet   polyethylene glycol powder 17 GM/SCOOP powder Commonly known as: GLYCOLAX/MIRALAX   pravastatin 20 MG tablet Commonly known as: PRAVACHOL   vitamin B-12 500 MCG tablet Commonly known as: CYANOCOBALAMIN       TAKE these medications    acetaminophen 650 MG CR tablet Commonly known as: TYLENOL Take 1,300 mg by mouth every 8 (eight) hours as needed.   oxyCODONE-acetaminophen 5-325 MG tablet Commonly known as: PERCOCET/ROXICET Take 1 tablet by mouth every 4 (four) hours as needed for moderate pain.          No Known Allergies   The results of significant diagnostics from this hospitalization (including imaging, microbiology, ancillary and  laboratory) are listed below for reference.   Consultations:   Procedures/Studies: DG Lumbar Spine Complete  Result Date: 12/06/2021 CLINICAL DATA:  [place] and has pain over the right sacroiliac area and in the low spine area around possibly L4 EXAM: LUMBAR SPINE - COMPLETE 4+ VIEW COMPARISON:  Radiographs 10/16/2021, CT 09/05/2019. FINDINGS: Levoconvex lumbar curvature. There is a compression fracture of T12, and a possible compression deformities of T8 and T11. There is chronic appearing compression deformity of L4 and superior endplate depression of L1. Unchanged multilevel degenerative disc disease and facet arthropathy with grade 1 anterolisthesis at L5-S1. IMPRESSION: T12 compression fracture and possible compression deformities of T8 and T11. Chronic compression deformities of L4 and the superior endplate of L1. Unchanged multilevel degenerative changes with grade 1 anterolisthesis at L5-S1. Electronically Signed   By: Maurine Simmering M.D.   On: 12/06/2021 09:59   DG Pelvis 1-2 Views  Result Date: 12/06/2021 CLINICAL DATA:  Status post fall, right SI joint pain EXAM: PELVIS - 1-2 VIEW COMPARISON:  None Available. FINDINGS: No acute fracture or dislocation. No  aggressive osseous lesion. Normal alignment. Generalized osteopenia. Soft tissue are unremarkable. No radiopaque foreign body or soft tissue emphysema. IMPRESSION: 1. No acute osseous injury of the pelvis. Given the patient's age and osteopenia, if there is persistent clinical concern for an occult fracture, a MRI of the pelvis is recommended for increased sensitivity. Electronically Signed   By: Kathreen Devoid M.D.   On: 12/06/2021 09:47   CT HEAD WO CONTRAST (5MM)  Result Date: 12/07/2021 CLINICAL DATA:  Head trauma, minor (Age >= 65y) EXAM: CT HEAD WITHOUT CONTRAST TECHNIQUE: Contiguous axial images were obtained from the base of the skull through the vertex without intravenous contrast. RADIATION DOSE REDUCTION: This exam was performed according  to the departmental dose-optimization program which includes automated exposure control, adjustment of the mA and/or kV according to patient size and/or use of iterative reconstruction technique. COMPARISON:  CT head May 8, 23. FINDINGS: Brain: No evidence of acute infarction, hemorrhage, hydrocephalus, extra-axial collection or mass lesion/mass effect. Mild for age cerebral atrophy and chronic microvascular ischemic disease. Vascular: No hyperdense vessel identified. Calcific intracranial atherosclerosis. Skull: No acute fracture. Sinuses/Orbits: Clear sinuses.  No acute orbital findings. Other: No mastoid effusions. IMPRESSION: No evidence of acute intracranial abnormality. Electronically Signed   By: Margaretha Sheffield M.D.   On: 12/07/2021 08:46   CT Head Wo Contrast  Result Date: 12/06/2021 CLINICAL DATA:  Neck trauma.  Fall.  Hematoma to posterior scalp EXAM: CT HEAD WITHOUT CONTRAST CT CERVICAL SPINE WITHOUT CONTRAST TECHNIQUE: Multidetector CT imaging of the head and cervical spine was performed following the standard protocol without intravenous contrast. Multiplanar CT image reconstructions of the cervical spine were also generated. RADIATION DOSE REDUCTION: This exam was performed according to the departmental dose-optimization program which includes automated exposure control, adjustment of the mA and/or kV according to patient size and/or use of iterative reconstruction technique. COMPARISON:  10/17/2021 FINDINGS: CT HEAD FINDINGS Brain: No evidence of acute infarction, hemorrhage, hydrocephalus, extra-axial collection or mass lesion/mass effect. Prominence of the sulci and ventricles compatible with brain atrophy. There is mild low-attenuation within the subcortical and periventricular white matter compatible with chronic microvascular disease. Vascular: No hyperdense vessel or unexpected calcification. Skull: Normal. Negative for fracture or focal lesion. Sinuses/Orbits: The paranasal sinuses and  the mastoid air cells are clear. Other: Right posterior scalp hematoma, image 20/31. CT CERVICAL SPINE FINDINGS Alignment: Normal. Skull base and vertebrae: No acute fracture. No primary bone lesion or focal pathologic process. Soft tissues and spinal canal: No prevertebral fluid or swelling. No visible canal hematoma. Disc levels: There is solid fusion of the disc spaces and posterior elements of C3 through C5. Marked degenerative disc disease is identified at C5-6 and C6-7. Upper chest: Negative. Other: None IMPRESSION: 1. No acute intracranial abnormality. 2. Right posterior scalp hematoma without evidence for underlying skull fracture. 3. Chronic small vessel ischemic change and brain atrophy. 4. No evidence for cervical spine fracture or subluxation. 5. Advanced cervical degenerative disc disease at C5-6 and C6-7. Solid fusion of the disc spaces and posterior elements of C3 through C5. Electronically Signed   By: Kerby Moors M.D.   On: 12/06/2021 08:52   CT Cervical Spine Wo Contrast  Result Date: 12/06/2021 CLINICAL DATA:  Neck trauma.  Fall.  Hematoma to posterior scalp EXAM: CT HEAD WITHOUT CONTRAST CT CERVICAL SPINE WITHOUT CONTRAST TECHNIQUE: Multidetector CT imaging of the head and cervical spine was performed following the standard protocol without intravenous contrast. Multiplanar CT image reconstructions of the cervical spine were  also generated. RADIATION DOSE REDUCTION: This exam was performed according to the departmental dose-optimization program which includes automated exposure control, adjustment of the mA and/or kV according to patient size and/or use of iterative reconstruction technique. COMPARISON:  10/17/2021 FINDINGS: CT HEAD FINDINGS Brain: No evidence of acute infarction, hemorrhage, hydrocephalus, extra-axial collection or mass lesion/mass effect. Prominence of the sulci and ventricles compatible with brain atrophy. There is mild low-attenuation within the subcortical and  periventricular white matter compatible with chronic microvascular disease. Vascular: No hyperdense vessel or unexpected calcification. Skull: Normal. Negative for fracture or focal lesion. Sinuses/Orbits: The paranasal sinuses and the mastoid air cells are clear. Other: Right posterior scalp hematoma, image 20/31. CT CERVICAL SPINE FINDINGS Alignment: Normal. Skull base and vertebrae: No acute fracture. No primary bone lesion or focal pathologic process. Soft tissues and spinal canal: No prevertebral fluid or swelling. No visible canal hematoma. Disc levels: There is solid fusion of the disc spaces and posterior elements of C3 through C5. Marked degenerative disc disease is identified at C5-6 and C6-7. Upper chest: Negative. Other: None IMPRESSION: 1. No acute intracranial abnormality. 2. Right posterior scalp hematoma without evidence for underlying skull fracture. 3. Chronic small vessel ischemic change and brain atrophy. 4. No evidence for cervical spine fracture or subluxation. 5. Advanced cervical degenerative disc disease at C5-6 and C6-7. Solid fusion of the disc spaces and posterior elements of C3 through C5. Electronically Signed   By: Kerby Moors M.D.   On: 12/06/2021 08:52   CT CHEST ABDOMEN PELVIS W CONTRAST  Result Date: 12/06/2021 CLINICAL DATA:  Patient slipped and fell.  Head injury. EXAM: CT CHEST, ABDOMEN, AND PELVIS WITH CONTRAST TECHNIQUE: Multidetector CT imaging of the chest, abdomen and pelvis was performed following the standard protocol during bolus administration of intravenous contrast. RADIATION DOSE REDUCTION: This exam was performed according to the departmental dose-optimization program which includes automated exposure control, adjustment of the mA and/or kV according to patient size and/or use of iterative reconstruction technique. CONTRAST:  148m OMNIPAQUE IOHEXOL 300 MG/ML  SOLN COMPARISON:  Abdomen pelvis CT 09/05/2019 FINDINGS: CT CHEST FINDINGS Cardiovascular: The heart  size is normal. No substantial pericardial effusion. Coronary artery calcification is evident. Status post CABG. Left permanent pacemaker noted. Moderate atherosclerotic calcification is noted in the wall of the thoracic aorta. Mediastinum/Nodes: No mediastinal lymphadenopathy. There is no hilar lymphadenopathy. Moderate hiatal hernia. The esophagus has normal imaging features. There is no axillary lymphadenopathy. Lungs/Pleura: Interstitial markings are diffusely coarsened with chronic features. Scar sec chronic interstitial changes noted. 6 mm right upper lobe nodule visible on 49/4. This is stable since CTA chest 12/07/2018 consistent with benign etiology. No followup recommended. There is some airspace disease in both dependent lung bases, likely atelectasis although patchy pneumonia cannot be excluded. No substantial pleural effusion. Musculoskeletal: No worrisome lytic or sclerotic osseous abnormality. Age indeterminate compression deformity at T12 is new since prior CT of 2021. For more definitive characterization of thoracic bony anatomy, please see report for dedicated thoracic spine CT performed at the same time. CT ABDOMEN PELVIS FINDINGS Hepatobiliary: Tiny hypodensity lateral segment left liver is unchanged compatible benign etiology. No followup recommended. Numerous calcified granulomata noted within the parenchyma. Gallbladder surgically absent. No intrahepatic or extrahepatic biliary dilation. Pancreas: No focal mass lesion. No dilatation of the main duct. No intraparenchymal cyst. No peripancreatic edema. Spleen: Calcified granulomata. Adrenals/Urinary Tract: No adrenal nodule or mass. Bilateral renal cysts evident. No evidence for hydroureter. The urinary bladder appears normal for the degree of  distention. Stomach/Bowel: Moderate hiatal hernia. Stomach is nondistended. Duodenum is normally positioned as is the ligament of Treitz. No small bowel wall thickening. No small bowel dilatation. The  terminal ileum is normal. The appendix is normal. No gross colonic mass. No colonic wall thickening. Diverticular changes are noted in the left colon without evidence of diverticulitis. Vascular/Lymphatic: There is moderate atherosclerotic calcification of the abdominal aorta without aneurysm. There is no gastrohepatic or hepatoduodenal ligament lymphadenopathy. No retroperitoneal or mesenteric lymphadenopathy. No pelvic sidewall lymphadenopathy. Reproductive: The prostate gland and seminal vesicles are unremarkable. Other: No intraperitoneal free fluid. Musculoskeletal: No worrisome lytic or sclerotic osseous abnormality. Please see report for dedicated lumbar spine CT for more definitive characterization of lumbar bony anatomy. IMPRESSION: 1. No acute traumatic injury in the chest, abdomen, or pelvis. 2. Age indeterminate compression deformity at T12 is new since prior CT of 2021. Please see report for dedicated thoracolumbar spine CT for more definitive characterization. 3. Moderate hiatal hernia. 4. Left colonic diverticulosis without diverticulitis. 5. 6 mm right upper lobe nodule stable since 12/07/2018, consistent with benign etiology. No follow-up indicated. 6. Aortic Atherosclerosis (ICD10-I70.0). Electronically Signed   By: Misty Stanley M.D.   On: 12/06/2021 12:59   CT T-SPINE NO CHARGE  Result Date: 12/06/2021 CLINICAL DATA:  Fall.  Back pain. EXAM: CT THORACIC AND LUMBAR SPINE WITH CONTRAST TECHNIQUE: Multiplanar CT images of the thoracic and lumbar spine were reconstructed from contemporary CT of the Chest, Abdomen, and Pelvis. RADIATION DOSE REDUCTION: This exam was performed according to the departmental dose-optimization program which includes automated exposure control, adjustment of the mA and/or kV according to patient size and/or use of iterative reconstruction technique. CONTRAST:  No additional COMPARISON:  Lumbar spine radiographs 12/06/2021. Thoracic spine radiographs 10/16/2021. CT  abdomen and pelvis 09/05/2019. CTA chest 12/07/2018. FINDINGS: CT THORACIC SPINE FINDINGS Alignment: Severe lower thoracic dextroscoliosis. Increased thoracic kyphosis. No significant listhesis. Vertebrae: There is a T12 superior endplate compression fracture with 35% height loss and some sclerosis along the fracture line which may indicate a subacute nature a T11 vertebral body fracture has a more acute appearance and demonstrates 10% vertebral body height loss. There is no significant retropulsion, and no posterior element fracture is identified. A small Schmorl's node involves the T6 inferior endplate. There is interbody ankylosis from T8-T11. Diffuse osteopenia is noted. Paraspinal and other soft tissues: Paravertebral soft tissue swelling/hematoma at T11. Intrathoracic contents reported separately. Disc levels: Cervical spine reported separately. Mild disc and facet degeneration in the thoracic spine without evidence of high-grade stenosis. CT LUMBAR SPINE FINDINGS Segmentation: 5 lumbar type vertebrae. Alignment: Severe lumbar levoscoliosis. Trace retrolisthesis of L1 on L2. 5 mm anterolisthesis of L5 on S1. Vertebrae: No acute fracture or suspicious osseous lesion. Diffuse osteopenia. Interbody ankylosis from L1-L3. Paraspinal and other soft tissues: No acute abnormality identified in the paraspinal soft tissues. Intra-abdominal and pelvic contents reported separately. Disc levels: Asymmetrically advanced left facet arthrosis at L4-5 and L5-S1. Mild-to-moderate right and moderate to severe left neural foraminal stenosis at L5-S1 due to bulging uncovered disc and spurring. No evidence of high-grade spinal canal stenosis. IMPRESSION: 1. Acute T11 vertebral body fracture with 10% height loss. 2. Possibly subacute T12 compression fracture with 35% height loss. 3. No acute fracture in the lumbar spine. 4. Severe thoracolumbar scoliosis. Electronically Signed   By: Logan Bores M.D.   On: 12/06/2021 13:12   CT  L-SPINE NO CHARGE  Result Date: 12/06/2021 CLINICAL DATA:  Fall.  Back pain. EXAM: CT THORACIC  AND LUMBAR SPINE WITH CONTRAST TECHNIQUE: Multiplanar CT images of the thoracic and lumbar spine were reconstructed from contemporary CT of the Chest, Abdomen, and Pelvis. RADIATION DOSE REDUCTION: This exam was performed according to the departmental dose-optimization program which includes automated exposure control, adjustment of the mA and/or kV according to patient size and/or use of iterative reconstruction technique. CONTRAST:  No additional COMPARISON:  Lumbar spine radiographs 12/06/2021. Thoracic spine radiographs 10/16/2021. CT abdomen and pelvis 09/05/2019. CTA chest 12/07/2018. FINDINGS: CT THORACIC SPINE FINDINGS Alignment: Severe lower thoracic dextroscoliosis. Increased thoracic kyphosis. No significant listhesis. Vertebrae: There is a T12 superior endplate compression fracture with 35% height loss and some sclerosis along the fracture line which may indicate a subacute nature a T11 vertebral body fracture has a more acute appearance and demonstrates 10% vertebral body height loss. There is no significant retropulsion, and no posterior element fracture is identified. A small Schmorl's node involves the T6 inferior endplate. There is interbody ankylosis from T8-T11. Diffuse osteopenia is noted. Paraspinal and other soft tissues: Paravertebral soft tissue swelling/hematoma at T11. Intrathoracic contents reported separately. Disc levels: Cervical spine reported separately. Mild disc and facet degeneration in the thoracic spine without evidence of high-grade stenosis. CT LUMBAR SPINE FINDINGS Segmentation: 5 lumbar type vertebrae. Alignment: Severe lumbar levoscoliosis. Trace retrolisthesis of L1 on L2. 5 mm anterolisthesis of L5 on S1. Vertebrae: No acute fracture or suspicious osseous lesion. Diffuse osteopenia. Interbody ankylosis from L1-L3. Paraspinal and other soft tissues: No acute abnormality  identified in the paraspinal soft tissues. Intra-abdominal and pelvic contents reported separately. Disc levels: Asymmetrically advanced left facet arthrosis at L4-5 and L5-S1. Mild-to-moderate right and moderate to severe left neural foraminal stenosis at L5-S1 due to bulging uncovered disc and spurring. No evidence of high-grade spinal canal stenosis. IMPRESSION: 1. Acute T11 vertebral body fracture with 10% height loss. 2. Possibly subacute T12 compression fracture with 35% height loss. 3. No acute fracture in the lumbar spine. 4. Severe thoracolumbar scoliosis. Electronically Signed   By: Logan Bores M.D.   On: 12/06/2021 13:12   DG Chest Portable 1 View  Result Date: 12/06/2021 CLINICAL DATA:  Worsening oxygenation EXAM: PORTABLE CHEST 1 VIEW COMPARISON:  12/06/2021 at 1047 hours FINDINGS: Left-sided implanted cardiac device. Prior median sternotomy and CABG. Stable heart size. Aortic atherosclerosis. Low lung volumes with streaky bibasilar opacities. Background of chronic coarsened interstitial markings. No pleural effusion or pneumothorax. IMPRESSION: Stable chest x-ray. Low lung volumes with streaky bibasilar opacities likely representing atelectasis. Electronically Signed   By: Davina Poke D.O.   On: 12/06/2021 14:26   DG Chest Portable 1 View  Result Date: 12/06/2021 CLINICAL DATA:  Fall initially had good blood pressure now blood pressures dropped into the 60s EXAM: PORTABLE CHEST 1 VIEW COMPARISON:  Chest x-ray October 20, 2021. FINDINGS: Mild prominence of the lung markings, chronic. Similar streaky left greater than right basilar opacities, probably atelectasis/scar. No new consolidation. Similar cardiomediastinal silhouette. Left subclavian approach cardiac rhythm maintenance device. Median sternotomy. Dextrocurvature of the spine with multilevel degenerative change. Cholecystectomy clips. IMPRESSION: No evidence of acute cardiopulmonary disease. Similar probable atelectasis/scar at the lung  bases. Electronically Signed   By: Margaretha Sheffield M.D.   On: 12/06/2021 11:16   ECHOCARDIOGRAM COMPLETE  Result Date: 12/06/2021    ECHOCARDIOGRAM REPORT   Patient Name:   Harry Vargas Date of Exam: 12/06/2021 Medical Rec #:  [id number]       Height:       64.0 in Accession #:  [id number]      Weight:       156.5 lb Date of Birth:  [birth year]       BSA:          1.763 m Patient Age:    49 years        BP:           80/51 mmHg Patient Gender: M               HR:           75 bpm. Exam Location:  [facility] Procedure: 2D Echo, Cardiac Doppler and Color Doppler Indications:     I50.31 CHF Acute Diastolic  History:         Patient has prior history of Echocardiogram examinations. CHF,                  CAD, Prior CABG and Pacemaker, History of prostate cancer,                  Arrythmias:Atrial Flutter; Risk Factors:Asthma, Hypertension                  and Dyslipidemia.  Sonographer:     Rosalia Hammers Referring Phys:  [id number] Soledad Gerlach NIU Diagnosing Phys: Kathlyn Sacramento MD  Sonographer Comments: Technically difficult study due to poor echo windows, suboptimal parasternal window, suboptimal apical window and suboptimal subcostal window. Image acquisition challenging due to uncooperative patient and Image acquisition challenging due to respiratory motion. IMPRESSIONS  1. Left ventricular ejection fraction, by estimation, is 50 to 55%. The left ventricle has low normal function. Left ventricular endocardial border not optimally defined to evaluate regional wall motion. There is mild left ventricular hypertrophy. Left ventricular diastolic parameters are consistent with Grade I diastolic dysfunction (impaired relaxation).  2. Right ventricular systolic function is normal. The right ventricular size is normal. There is mildly elevated pulmonary artery systolic pressure. The estimated right ventricular systolic pressure is 65.7 mmHg.  3. The mitral valve is normal in structure. No evidence of mitral valve regurgitation. No  evidence of mitral stenosis.  4. The aortic valve is calcified. Aortic valve regurgitation is not visualized. Aortic valve sclerosis/calcification is present, without any evidence of aortic stenosis. FINDINGS  Left Ventricle: Left ventricular ejection fraction, by estimation, is 50 to 55%. The left ventricle has low normal function. Left ventricular endocardial border not optimally defined to evaluate regional wall motion. The left ventricular internal cavity  size was normal in size. There is mild left ventricular hypertrophy. Left ventricular diastolic parameters are consistent with Grade I diastolic dysfunction (impaired relaxation). Right Ventricle: The right ventricular size is normal. No increase in right ventricular wall thickness. Right ventricular systolic function is normal. There is mildly elevated pulmonary artery systolic pressure. The tricuspid regurgitant velocity is 3.07  m/s, and with an assumed right atrial pressure of 5 mmHg, the estimated right ventricular systolic pressure is 84.6 mmHg. Left Atrium: Left atrial size was normal in size. Right Atrium: Right atrial size was normal in size. Pericardium: There is no evidence of pericardial effusion. Mitral Valve: The mitral valve is normal in structure. No evidence of mitral valve regurgitation. No evidence of mitral valve stenosis. MV peak gradient, 4.3 mmHg. The mean mitral valve gradient is 2.0 mmHg. Tricuspid Valve: The tricuspid valve is normal in structure. Tricuspid valve regurgitation is mild . No evidence of tricuspid stenosis. Aortic Valve: The aortic valve is calcified. Aortic valve regurgitation is not visualized. Aortic valve sclerosis/calcification is present,  without any evidence of aortic stenosis. Aortic valve mean gradient measures 7.0 mmHg. Aortic valve peak gradient measures 12.8 mmHg. Aortic valve area, by VTI measures 1.07 cm. Pulmonic Valve: The pulmonic valve was normal in structure. Pulmonic valve regurgitation is not  visualized. No evidence of pulmonic stenosis. Aorta: The aortic root is normal in size and structure. Venous: The inferior vena cava was not well visualized. IAS/Shunts: No atrial level shunt detected by color flow Doppler.  LEFT VENTRICLE PLAX 2D LVIDd:         3.43 cm   Diastology LVIDs:         2.74 cm   LV e' medial:    4.57 cm/s LV PW:         1.16 cm   LV E/e' medial:  14.6 LV IVS:        1.24 cm   LV e' lateral:   4.57 cm/s LVOT diam:     1.60 cm   LV E/e' lateral: 14.6 LV SV:         33 LV SV Index:   19 LVOT Area:     2.01 cm  RIGHT VENTRICLE RV S prime:     13.80 cm/s LEFT ATRIUM         Index LA diam:    4.10 cm 2.33 cm/m  AORTIC VALVE                     PULMONIC VALVE AV Area (Vmax):    1.00 cm      PV Vmax:       0.92 m/s AV Area (Vmean):   1.01 cm      PV Vmean:      65.300 cm/s AV Area (VTI):     1.07 cm      PV VTI:        0.165 m AV Vmax:           179.00 cm/s   PV Peak grad:  3.4 mmHg AV Vmean:          121.000 cm/s  PV Mean grad:  2.0 mmHg AV VTI:            0.306 m AV Peak Grad:      12.8 mmHg AV Mean Grad:      7.0 mmHg LVOT Vmax:         89.40 cm/s LVOT Vmean:        60.600 cm/s LVOT VTI:          0.163 m LVOT/AV VTI ratio: 0.53  AORTA Ao Root diam: 3.30 cm MITRAL VALVE               TRICUSPID VALVE MV Area (PHT): 1.63 cm    TR Peak grad:   37.7 mmHg MV Area VTI:   1.17 cm    TR Vmax:        307.00 cm/s MV Peak grad:  4.3 mmHg MV Mean grad:  2.0 mmHg    SHUNTS MV Vmax:       1.04 m/s    Systemic VTI:  0.16 m MV Vmean:      60.4 cm/s   Systemic Diam: 1.60 cm MV Decel Time: 466 msec MV E velocity: 66.80 cm/s MV A velocity: 99.00 cm/s MV E/A ratio:  0.67 Kathlyn Sacramento MD Electronically signed by Kathlyn Sacramento MD Signature Date/Time: 12/06/2021/4:56:47 PM    Final       Labs: BNP (last 3 results) Recent Labs  10/20/21 0948 12/06/21 0824  BNP 155.6* 301.3*   Basic Metabolic Panel: Recent Labs  Lab 12/06/21 0824 12/07/21 0615  NA 138 138  K 4.3 4.0  CL 96* 97*  CO2 33*  32  GLUCOSE 93 118*  BUN 19 25*  CREATININE 1.00 1.18  CALCIUM 9.0 8.8*  MG  --  2.1   Liver Function Tests: No results for input(s): AST, ALT, ALKPHOS, BILITOT, PROT, ALBUMIN in the last 168 hours. Recent Labs  Lab 12/06/21 0824  LIPASE 55*   No results for input(s): AMMONIA in the last 168 hours. CBC: Recent Labs  Lab 12/06/21 0824 12/07/21 0615  WBC 9.9 10.9*  NEUTROABS 7.6  --   HGB 15.8 12.8*  HCT 52.7* 42.6  MCV 86.4 85.9  PLT 171 139*   Cardiac Enzymes: No results for input(s): CKTOTAL, CKMB, CKMBINDEX, TROPONINI in the last 168 hours. BNP: Invalid input(s): POCBNP CBG: No results for input(s): GLUCAP in the last 168 hours. D-Dimer No results for input(s): DDIMER in the last 72 hours. Hgb A1c No results for input(s): HGBA1C in the last 72 hours. Lipid Profile No results for input(s): CHOL, HDL, LDLCALC, TRIG, CHOLHDL, LDLDIRECT in the last 72 hours. Thyroid function studies No results for input(s): TSH, T4TOTAL, T3FREE, THYROIDAB in the last 72 hours.  Invalid input(s): FREET3 Anemia work up No results for input(s): VITAMINB12, FOLATE, FERRITIN, TIBC, IRON, RETICCTPCT in the last 72 hours. Urinalysis    Component Value Date/Time   COLORURINE YELLOW (A) 12/07/2021 0445   APPEARANCEUR CLEAR (A) 12/07/2021 0445   LABSPEC >1.046 (H) 12/07/2021 0445   PHURINE 7.0 12/07/2021 [place] 12/07/2021 0445   HGBUR NEGATIVE 12/07/2021 0445   [place] 12/07/2021 0445   KETONESUR NEGATIVE 12/07/2021 0445   PROTEINUR 30 (A) 12/07/2021 0445   NITRITE NEGATIVE 12/07/2021 0445   LEUKOCYTESUR NEGATIVE 12/07/2021 0445   Sepsis Labs Invalid input(s): PROCALCITONIN,  WBC,  LACTICIDVEN Microbiology Recent Results (from the past 240 hour(s))  Resp Panel by RT-PCR (Flu A&B, Covid) Nasopharyngeal Swab     Status: None   Collection Time: 12/07/21 12:17 AM   Specimen: Nasopharyngeal Swab; Nasopharyngeal(NP) swabs in vial transport medium  Result  Value Ref Range Status   SARS Coronavirus 2 by RT PCR NEGATIVE NEGATIVE Final    Comment: (NOTE) SARS-CoV-2 target nucleic acids are NOT DETECTED.  The SARS-CoV-2 RNA is generally detectable in upper respiratory specimens during the acute phase of infection. The lowest concentration of SARS-CoV-2 viral copies this assay can detect is 138 copies/mL. A negative result does not preclude SARS-Cov-2 infection and should not be used as the sole basis for treatment or other patient management decisions. A negative result may occur with  improper specimen collection/handling, submission of specimen other than nasopharyngeal swab, presence of viral mutation(s) within the areas targeted by this assay, and inadequate number of viral copies(<138 copies/mL). A negative result must be combined with clinical observations, patient history, and epidemiological information. The expected result is Negative.  Fact Sheet for Patients:  EntrepreneurPulse.com.au  Fact Sheet for Healthcare Providers:  IncredibleEmployment.be  This test is no t yet approved or cleared by the [place] FDA and  has been authorized for detection and/or diagnosis of SARS-CoV-2 by FDA under an Emergency Use Authorization (EUA). This EUA will remain  in effect (meaning this test can be used) for the duration of the COVID-19 declaration under Section 564(b)(1) of the Act, 21 U.S.C.section 360bbb-3(b)(1), unless the authorization is terminated  or  revoked sooner.       Influenza A by PCR NEGATIVE NEGATIVE Final   Influenza B by PCR NEGATIVE NEGATIVE Final    Comment: (NOTE) The Xpert Xpress SARS-CoV-2/FLU/RSV plus assay is intended as an aid in the diagnosis of influenza from Nasopharyngeal swab specimens and should not be used as a sole basis for treatment. Nasal washings and aspirates are unacceptable for Xpert Xpress SARS-CoV-2/FLU/RSV testing.  Fact Sheet for  Patients: EntrepreneurPulse.com.au  Fact Sheet for Healthcare Providers: IncredibleEmployment.be  This test is not yet approved or cleared by the [place] FDA and has been authorized for detection and/or diagnosis of SARS-CoV-2 by FDA under an Emergency Use Authorization (EUA). This EUA will remain in effect (meaning this test can be used) for the duration of the COVID-19 declaration under Section 564(b)(1) of the Act, 21 U.S.C. section 360bbb-3(b)(1), unless the authorization is terminated or revoked.  Performed at [facility], [address] [place], [place] [address]      Total time spend on discharging this patient, including the last patient exam, discussing the hospital stay, instructions for ongoing care as it relates to all pertinent caregivers, as well as preparing the medical discharge records, prescriptions, and/or referrals as applicable, is 40 minutes.    Enzo Bi, MD  [facility] 12/10/2021, 12:26 PM

## 2021-12-10 NOTE — TOC Transition Note (Signed)
Transition of Care (TOC) - CM/SW Discharge Note ? ? ?Patient Details  ?Name: Harry Vargas ?MRN: [id number] ?Date of Birth: 06-04-1929 ? ?Transition of Care (TOC) CM/SW Contact:  ?Shelbie Hutching, RN ?Phone Number: ?12/10/2021, 12:53 PM ? ? ?Clinical Narrative:    ?Patient will discharge to [facility] in [place] today.  Patient will be going with OP Palliative services through [facility].  Patient going to room E3, bedside RN will call report to [phone].  RNCM will call and arrange EMS transport.  RNCM updated patient and daughter at the bedside.   ? ? ?Final next level of care: [place] ?Barriers to Discharge: Barriers Resolved ? ? ?Patient Goals and CMS Choice ?Patient states their goals for this hospitalization and ongoing recovery are:: To return to [facility] ?CMS Medicare.gov Compare Post Acute Care list provided to:: Patient Represenative (must comment) ?Choice offered to / list presented to : Adult Children ? ?Discharge Placement ?  ?           ?Patient chooses bed at: [facility] ?Patient to be transferred to facility by: [facility] ?Name of family member notified: [name]- daughter ?Patient and family notified of of transfer: 12/10/21 ? ?Discharge Plan and Services ?  ?  ?           ?DME Arranged: N/A ?DME Agency: NA ?  ?  ?  ?HH Arranged: NA ?[place] Agency: NA ?  ?  ?  ? ?Social Determinants of Health (SDOH) Interventions ?  ? ? ?Readmission Risk Interventions ?   ? View : No data to display.  ?  ?  ?  ? ? ? ? ? ?

## 2021-12-13 ENCOUNTER — Non-Acute Institutional Stay: Payer: Medicare Other | Admitting: Primary Care

## 2021-12-13 DIAGNOSIS — E440 Moderate protein-calorie malnutrition: Secondary | ICD-10-CM

## 2021-12-13 DIAGNOSIS — M818 Other osteoporosis without current pathological fracture: Secondary | ICD-10-CM

## 2021-12-13 DIAGNOSIS — I5032 Chronic diastolic (congestive) heart failure: Secondary | ICD-10-CM

## 2021-12-13 DIAGNOSIS — W19XXXS Unspecified fall, sequela: Secondary | ICD-10-CM

## 2021-12-13 DIAGNOSIS — F0280 Dementia in other diseases classified elsewhere without behavioral disturbance: Secondary | ICD-10-CM

## 2021-12-13 DIAGNOSIS — E663 Overweight: Secondary | ICD-10-CM

## 2021-12-13 NOTE — Progress Notes (Signed)
 Therapist, nutritional Palliative Care Consult Note Telephone: [phone]  Fax: [phone]   Date of encounter: 12/13/21 4:21 PM PATIENT NAME: Harry Vargas [address] [place] [address]   [phone] (home)  DOB: May 24, 1929 MRN: [id number] PRIMARY CARE PROVIDER:    Cleotilde Oneil FALCON, MD,  [address] [facility] [place] [address] [phone]  REFERRING PROVIDER:   Laurine Gladden, MD [address] [place],  [place] [address] [phone]   RESPONSIBLE PARTY:    Contact Information     Name Relation Home Work [place] Daughter [phone]  [phone]        I met face to face with patient and family in [facility] facility. Palliative Care was asked to follow this patient by consultation request of  Slade-Hartman, Gladden*  to address advance care planning and complex medical decision making. This is the initial visit.                                     ASSESSMENT AND PLAN / RECOMMENDATIONS:   Advance Care Planning/Goals of Care: Goals include to maximize quality of life and symptom management. Patient/health care surrogate gave his/her permission to discuss.Daughter and POA Debbie in attendance. Our advance care planning conversation included a discussion about:    The value and importance of advance care planning  Experiences with loved ones who have been seriously ill or have died - patient's wife has died some time ago. Exploration of personal, cultural or spiritual beliefs that might influence medical decisions  Exploration of goals of care in the event of a sudden injury or illness. Identification of a healthcare agent  Review and creation of an advance directive document. Decision to de-escalate disease focused treatments due to poor prognosis Daughter outlined pt goals of comfort measures, wanting hospice when it's time for that. Patient has had several falls and  hospital stays in the past 2 months. Pt would like to reduce doctor appointments and focus on comfort measures. CODE STATUS: DNR  I completed a MOST form today. The patient and family outlined their wishes for the following treatment decisions. POA asks for oxygen to not be used at it is life prolonging at EOL.   Cardiopulmonary Resuscitation: Do Not Attempt Resuscitation (DNR/No CPR)  Medical Interventions: Comfort Measures: Keep clean, warm, and dry. Use medication by any route, positioning, wound care, and other measures to relieve pain and suffering. Use oxygen, suction and manual treatment of airway obstruction as needed for comfort. Do not transfer to the hospital unless comfort needs cannot be met in current location.  Antibiotics: No antibiotics (use other measures to relieve symptoms)  IV Fluids: No IV fluids (provide other measures to ensure comfort)  Feeding Tube: No feeding tube   I spent 20 minutes providing this consultation. More than 50% of the time in this consultation was spent in counseling and care coordination. --------------------------------------------------------------------------------------------------------  Symptom Management/Plan:  Pain: Patient fell and has fracture, has great pain most of the time. I have adjusted his pain regimen to  give Percocet 5/325 mg  Q 6 hrs po, and percocet 5/325 mg q6hrs po prn pain. Sent to SNF pharmacy, #28, no refills.  Constipation: Has not had bm x 5 days, I have increased miralax to 17 gm daily. May need MOM in addition, discussed with floor LPN to use prn  and  gave order for daily administration.  Nutrition: Has declining albumin , now 3. Has puree diet but mainly likes potatoes. He should have nutritional supplements on his tray due to declining albumin . He has gained some weight and has 1 + LE edema.  Follow up Palliative Care Visit: Palliative care will continue to follow for complex medical decision making, advance care  planning, and clarification of goals. Return 2-4 weeks or prn.  This visit was coded based on medical decision making (MDM).  PPS: 30%  HOSPICE ELIGIBILITY/DIAGNOSIS: TBD  Chief Complaint: pain, debility, immobility  HISTORY OF PRESENT ILLNESS:  [name] is a 88 y.o. year old male  with Dementia, debility, fall with compression fractures. Patient seen today to review palliative care needs to include medical decision making and advance care planning as appropriate.   History obtained from review of EMR, discussion with primary team, and interview with family, facility staff/caregiver and/or Mr. Beal.  I reviewed available labs, medications, imaging, studies and related documents from the EMR.  Records reviewed and summarized above.   ROS  General: NAD ENMT: denies dysphagia, has thin liquids but puree meal Pulmonary: denies cough, denies increased SOB Abdomen: endorses good appetite,  endorses constipation, endorses occ incontinence of bowel GU: denies dysuria, endorses occ in continence of urine MSK:  endorses  increased weakness,  no recent falls reported Skin: denies rashes or wounds Neurological: endorses  pain, denies insomnia Psych: Endorses positive mood Heme/lymph/immuno: denies bruises, abnormal bleeding  Physical Exam: Current and past weights: 154 lbs, weight increase.  Constitutional: NAD General: frail appearing EYES: anicteric sclera, lids intact, no discharge  ENMT: hard of hearing, oral mucous membranes moist, dentition intact CV: S1S2, RRR, 1+ LE edema Pulmonary: LCTA, no increased work of breathing, no cough, room air Abdomen: intake 50%, normo-active BS + 4 quadrants, soft and non-tender, no ascites GU: deferred MSK: mod sarcopenia, moves all extremities, ambulatory with walker and stand by Skin: warm and dry, no rashes or wounds on visible skin Neuro:  + generalized weakness,  + cognitive impairment Psych: non-anxious affect, A and O x 1-2, FAST  score 6D,  Hem/lymph/immuno: no widespread bruising  CURRENT PROBLEM LIST:  Patient Active Problem List   Diagnosis Date Noted   Comfort measures only status 12/08/2021   Respiratory failure (HCC) 12/07/2021   Fall 12/06/2021   Acute respiratory failure with hypoxia (HCC) 12/06/2021   Compression fracture of thoracic vertebra (HCC) 12/06/2021   Chronic diastolic CHF (congestive heart failure) (HCC) 12/06/2021   Alzheimer's disease (HCC)    Dysphagia 10/21/2021   Overweight (BMI 25.0-29.9) 10/20/2021   Chronic combined systolic (congestive) and diastolic (congestive) heart failure (HCC) 10/20/2021   Malnutrition of moderate degree 10/19/2021   Delirium 10/18/2021   Neck pain 10/18/2021   Somnolence 10/17/2021   Falls 10/16/2021   Facial droop due to acute stroke (HCC) 10/16/2021   Other osteoporosis without current pathological fracture 02/14/2020   Alzheimer's dementia with behavioral disturbance (HCC) 09/05/2019   Atrial flutter (HCC) 01/10/2018   Cardiac pacemaker in situ 10/26/2017   Benign essential hypertension 09/27/2016   3-vessel CAD 07/06/2016   Cancer of prostate (HCC) 07/06/2016   Hyperlipidemia, mixed 07/06/2016   Prostate cancer (HCC) 1990   CAD (coronary artery disease) 1990   Atrial fibrillation, chronic (HCC) 1990   Acute metabolic encephalopathy 1990   Hypotension 1990   PAST MEDICAL HISTORY:  Active Ambulatory Problems    Diagnosis Date Noted   3-vessel CAD 07/06/2016   Cancer of prostate (  HCC) 07/06/2016   Hyperlipidemia, mixed 07/06/2016   Falls 10/16/2021   Benign essential hypertension 09/27/2016   Cardiac pacemaker in situ 10/26/2017   Alzheimer's dementia with behavioral disturbance (HCC) 09/05/2019   Atrial flutter (HCC) 01/10/2018   Facial droop due to acute stroke (HCC) 10/16/2021   Somnolence 10/17/2021   Delirium 10/18/2021   Neck pain 10/18/2021   Malnutrition of moderate degree 10/19/2021   Overweight (BMI 25.0-29.9) 10/20/2021    Chronic combined systolic (congestive) and diastolic (congestive) heart failure (HCC) 10/20/2021   Dysphagia 10/21/2021   Fall 12/06/2021   Acute respiratory failure with hypoxia (HCC) 12/06/2021   Compression fracture of thoracic vertebra (HCC) 12/06/2021   Chronic diastolic CHF (congestive heart failure) (HCC) 12/06/2021   Alzheimer's disease (HCC)    Prostate cancer (HCC) 1990   CAD (coronary artery disease) 1990   Atrial fibrillation, chronic (HCC) 1990   Acute metabolic encephalopathy 1990   Hypotension 1990   Respiratory failure (HCC) 12/07/2021   Comfort measures only status 12/08/2021   Other osteoporosis without current pathological fracture 02/14/2020   Resolved Ambulatory Problems    Diagnosis Date Noted   No Resolved Ambulatory Problems   Past Medical History:  Diagnosis Date   Arthritis    Asthma    B12 deficiency    Bleeding disorder (HCC)    Coronary artery disease    Dysrhythmia    GERD (gastroesophageal reflux disease)    Hyperlipidemia    Hypertension    NSVT (nonsustained ventricular tachycardia) (HCC)    Sinus node dysfunction (HCC)    SOCIAL HX:  Social History   Tobacco Use   Smoking status: Never   Smokeless tobacco: Never  Substance Use Topics   Alcohol use: No   FAMILY HX:  Family History  Problem Relation Age of Onset   Prostate cancer Neg Hx    Bladder Cancer Neg Hx    Kidney cancer Neg Hx       ALLERGIES: No Known Allergies   PERTINENT MEDICATIONS:  Outpatient Encounter Medications as of 12/13/2021  Medication Sig   oxyCODONE -acetaminophen  (PERCOCET/ROXICET) 5-325 MG tablet Take 1 tablet by mouth every 4 (four) hours as needed for moderate pain. (Patient taking differently: Take 1 tablet by mouth every 6 (six) hours as needed for moderate pain.)   oxyCODONE -acetaminophen  (PERCOCET/ROXICET) 5-325 MG tablet Take 1 tablet by mouth every 6 (six) hours.   polyethylene glycol (MIRALAX / GLYCOLAX) 17 g packet Take 17 g by mouth daily. In 4  oz juice   senna (SENOKOT) 8.6 MG TABS tablet Take 1 tablet by mouth daily.   acetaminophen  (TYLENOL ) 650 MG CR tablet Take 1,300 mg by mouth every 8 (eight) hours as needed.  (Patient not taking: Reported on 12/13/2021)   No facility-administered encounter medications on file as of 12/13/2021.    Thank you for the opportunity to participate in the care of Mr. Yankowski.  The palliative care team will continue to follow. Please call our office at [phone] if we can be of additional assistance.   Lamarr Welby Sharps, NP , DNP, AGPCNP-BC  COVID-19 PATIENT SCREENING TOOL Asked and negative response unless otherwise noted:  Have you had symptoms of covid, tested positive or been in contact with someone with symptoms/positive test in the past 5-10 days?

## 2021-12-29 ENCOUNTER — Encounter: Payer: Self-pay | Admitting: *Deleted

## 2021-12-29 ENCOUNTER — Emergency Department

## 2021-12-29 ENCOUNTER — Other Ambulatory Visit: Payer: Self-pay

## 2021-12-29 DIAGNOSIS — F0280 Dementia in other diseases classified elsewhere without behavioral disturbance: Secondary | ICD-10-CM | POA: Insufficient documentation

## 2021-12-29 DIAGNOSIS — W01198A Fall on same level from slipping, tripping and stumbling with subsequent striking against other object, initial encounter: Secondary | ICD-10-CM | POA: Insufficient documentation

## 2021-12-29 DIAGNOSIS — I2510 Atherosclerotic heart disease of native coronary artery without angina pectoris: Secondary | ICD-10-CM | POA: Diagnosis not present

## 2021-12-29 DIAGNOSIS — S0991XA Unspecified injury of ear, initial encounter: Secondary | ICD-10-CM | POA: Diagnosis present

## 2021-12-29 DIAGNOSIS — S01312A Laceration without foreign body of left ear, initial encounter: Secondary | ICD-10-CM | POA: Insufficient documentation

## 2021-12-29 DIAGNOSIS — I110 Hypertensive heart disease with heart failure: Secondary | ICD-10-CM | POA: Insufficient documentation

## 2021-12-29 DIAGNOSIS — G309 Alzheimer's disease, unspecified: Secondary | ICD-10-CM | POA: Diagnosis not present

## 2021-12-29 DIAGNOSIS — Z950 Presence of cardiac pacemaker: Secondary | ICD-10-CM | POA: Diagnosis not present

## 2021-12-29 DIAGNOSIS — Z951 Presence of aortocoronary bypass graft: Secondary | ICD-10-CM | POA: Insufficient documentation

## 2021-12-29 DIAGNOSIS — I5030 Unspecified diastolic (congestive) heart failure: Secondary | ICD-10-CM | POA: Insufficient documentation

## 2021-12-29 DIAGNOSIS — J45909 Unspecified asthma, uncomplicated: Secondary | ICD-10-CM | POA: Diagnosis not present

## 2021-12-29 NOTE — ED Triage Notes (Addendum)
Pt brought in via ems from [facility].  Pt fell today unwitnessed..  Pt has laceration to left ear.  Bleeding controlled.   No loc.  Denies neck or back pain.  Daughter with pt.  Hx dementia   no blood thinners per family

## 2021-12-29 NOTE — ED Notes (Signed)
Pt accompanied by daughter who is  requesting imaging and to have ear repaired only.  Pt has a DNR.

## 2021-12-30 ENCOUNTER — Emergency Department
Admission: EM | Admit: 2021-12-30 | Discharge: 2021-12-30 | Disposition: A | Discharge status: Skilled Nursing Facility | Attending: Emergency Medicine | Admitting: Emergency Medicine

## 2021-12-30 DIAGNOSIS — W19XXXA Unspecified fall, initial encounter: Secondary | ICD-10-CM

## 2021-12-30 DIAGNOSIS — S01312A Laceration without foreign body of left ear, initial encounter: Secondary | ICD-10-CM

## 2021-12-30 MED ORDER — OXYCODONE-ACETAMINOPHEN 5-325 MG PO TABS
1.0000 | ORAL_TABLET | Freq: Once | ORAL | Status: AC
  Administered 2021-12-30: 1 via ORAL
  Filled 2021-12-30: qty 1

## 2021-12-30 MED ORDER — LIDOCAINE HCL (PF) 1 % IJ SOLN
10.0000 mL | Freq: Once | INTRAMUSCULAR | Status: DC
Start: 2021-12-30 — End: 2021-12-30

## 2021-12-30 MED ORDER — LIDOCAINE HCL (PF) 1 % IJ SOLN
20.0000 mL | Freq: Once | INTRAMUSCULAR | Status: AC
  Administered 2021-12-30: 20 mL
  Filled 2021-12-30: qty 20

## 2021-12-30 NOTE — ED Notes (Signed)
Spoke w/ Shirlean Mylar at [facility] and notified Pt will be discharged and sent back to facility accompanied by his daughter. Pt's daughter requests staff will come by and help her out with this Pt when they arrive

## 2021-12-30 NOTE — ED Provider Notes (Signed)
[facility] Provider Note    Event Date/Time   First MD Initiated Contact with Patient 12/30/21 0012     (approximate)   History   Chief Complaint Fall   HPI  Harry Vargas is a 88 y.o. male with past medical history of hypertension, CAD status post CABG, atrial fibrillation, SA node dysfunction status post pacemaker, diastolic CHF, asthma, and Alzheimer's dementia who presents to the ED for fall.  Patient reports that he tripped and fell in his room at [facility], fall was apparently not witnessed by staff.  He reports hitting his head on the ground but denies losing consciousness, primarily complains of pain at his left ear.  He denies any headache, neck pain, or facial pain.  He does not take any blood thinners.  Daughter at bedside states patient is at his baseline mental status.     Physical Exam   Triage Vital Signs: ED Triage Vitals  Enc Vitals Group     BP 12/29/21 1937 139/88     Pulse Rate 12/29/21 1937 75     Resp 12/29/21 1937 20     Temp 12/29/21 1937 97.8 F (36.6 C)     Temp Source 12/29/21 1937 Oral     SpO2 12/29/21 1937 95 %     Weight 12/29/21 1929 156 lb (70.8 kg)     Height 12/29/21 1929 '5\' 4"'$  (1.626 m)     Head Circumference --      Peak Flow --      Pain Score 12/29/21 1929 0     Pain Loc --      Pain Edu? --      Excl. in [place]? --     Most recent vital signs: Vitals:   12/29/21 1937 12/30/21 0055  BP: 139/88 (!) 156/81  Pulse: 75 63  Resp: 20 18  Temp: 97.8 F (36.6 C)   SpO2: 95% 95%    Constitutional: Awake and alert. Eyes: Conjunctivae are normal. Head: Approximately 2 cm laceration to outer part of left ear with no involvement of cartilage.  No facial bony tenderness to palpation. Nose: No congestion/rhinnorhea. Mouth/Throat: Mucous membranes are moist.  Neck: No midline cervical spine tenderness to palpation. Cardiovascular: Normal rate, regular rhythm. Grossly normal heart sounds.  2+  radial pulses bilaterally. Respiratory: Normal respiratory effort.  No retractions. Lungs CTAB.  No chest wall tenderness to palpation. Gastrointestinal: Soft and nontender. No distention. Musculoskeletal: No lower extremity tenderness nor edema.  No upper extremity bony tenderness to palpation. Neurologic:  Normal speech and language. No gross focal neurologic deficits are appreciated.    ED Results / Procedures / Treatments   Labs (all labs ordered are listed, but only abnormal results are displayed) Labs Reviewed - No data to display  RADIOLOGY CT head reviewed and interpreted by me with no hemorrhage or midline shift.  PROCEDURES:  Critical Care performed: No  ..Laceration Repair  Date/Time: 12/30/2021 2:00 AM Performed by: Blake Divine, MD Authorized by: Blake Divine, MD   Consent:    Consent obtained:  Verbal   Consent given by:  Healthcare agent Universal protocol:    Patient identity confirmed:  Verbally with patient and arm band Anesthesia:    Anesthesia method:  Nerve block   Block location:  Auricular   Block needle gauge:  27 G   Block anesthetic:  Lidocaine 1% w/o epi   Block injection procedure:  Anatomic landmarks identified, introduced needle, incremental injection, negative aspiration for  blood and anatomic landmarks palpated   Block outcome:  Anesthesia achieved Laceration details:    Location:  Ear   Ear location:  L ear   Length (cm):  3 Pre-procedure details:    Preparation:  Patient was prepped and draped in usual sterile fashion and imaging obtained to evaluate for foreign bodies Exploration:    Limited defect created (wound extended): no     Hemostasis achieved with:  Direct pressure   Imaging obtained comment:  CT   Imaging outcome: foreign body not noted     Contaminated: no   Treatment:    Area cleansed with:  Saline   Amount of cleaning:  Standard   Irrigation solution:  Sterile water   Irrigation method:  Pressure wash    Visualized foreign bodies/material removed: no     Debridement:  None   Undermining:  None   Scar revision: no   Skin repair:    Repair method:  Sutures   Suture size:  5-0   Suture material:  Nylon   Suture technique:  Simple interrupted   Number of sutures:  3 Approximation:    Approximation:  Loose Repair type:    Repair type:  Simple Post-procedure details:    Dressing:  Open (no dressing)   Procedure completion:  Tolerated well, no immediate complications   MEDICATIONS ORDERED IN ED: Medications  oxyCODONE-acetaminophen (PERCOCET/ROXICET) 5-325 MG per tablet 1 tablet (1 tablet Oral Given 12/30/21 0054)  lidocaine (PF) (XYLOCAINE) 1 % injection 20 mL (20 mLs Infiltration Given 12/30/21 0054)     IMPRESSION / MDM / [place] / ED COURSE  I reviewed the triage vital signs and the nursing notes.                              88 y.o. male with past medical history of hypertension, CAD status post CABG, atrial fibrillation, SA node dysfunction status post pacemaker, diastolic CHF, asthma, and Alzheimer's dementia, who presents to the ED for trip and fall at his nursing facility, striking his head and left ear without LOC.  Patient's presentation is most consistent with acute complicated illness / injury requiring diagnostic workup.  Differential diagnosis includes, but is not limited to, intracranial hemorrhage, cervical spine injury, ear laceration, facial bony injury.  Patient well-appearing and in no acute distress, vital signs are unremarkable and he is at his baseline mental status per family with no focal neurologic deficits.  CT head and cervical spine are negative for acute process, no findings to suggest facial bony injury.  He does have small laceration to his left ear which we will repair.  Laceration repaired without difficulty, no significant auricular hematoma noted and no involvement of cartilage and laceration.  Patient is appropriate for discharge home with  suture removal in 1 week, daughter counseled to have him follow-up with his PCP or return to the ED for this.  Daughter agrees with plan.      FINAL CLINICAL IMPRESSION(S) / ED DIAGNOSES   Final diagnoses:  Fall, initial encounter  Laceration of left earlobe, initial encounter     Rx / DC Orders   ED Discharge Orders     None        Note:  This document was prepared using Dragon voice recognition software and may include unintentional dictation errors.   Blake Divine, MD 12/30/21 [phone]

## 2022-08-01 DEATH — deceased
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
